# Patient Record
Sex: Female | Born: 1970
Health system: Southern US, Community
[De-identification: ages and names within clinical notes are randomized; demographics above are authoritative.]

## PROBLEM LIST (undated history)

## (undated) DIAGNOSIS — K449 Diaphragmatic hernia without obstruction or gangrene: Secondary | ICD-10-CM

## (undated) DIAGNOSIS — I639 Cerebral infarction, unspecified: Secondary | ICD-10-CM

## (undated) DIAGNOSIS — G459 Transient cerebral ischemic attack, unspecified: Secondary | ICD-10-CM

## (undated) HISTORY — PX: INNER EAR SURGERY: SHX679

---
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
## 2004-07-22 ENCOUNTER — Emergency Department (HOSPITAL_COMMUNITY): Admission: EM | Admit: 2004-07-22 | Discharge: 2004-07-22 | Payer: Self-pay | Admitting: Emergency Medicine

## 2005-12-03 ENCOUNTER — Emergency Department (HOSPITAL_COMMUNITY): Admission: EM | Admit: 2005-12-03 | Discharge: 2005-12-03 | Payer: Self-pay | Admitting: Family Medicine

## 2006-10-12 ENCOUNTER — Emergency Department (HOSPITAL_COMMUNITY): Admission: EM | Admit: 2006-10-12 | Discharge: 2006-10-12 | Payer: Self-pay | Admitting: Emergency Medicine

## 2006-12-26 ENCOUNTER — Emergency Department (HOSPITAL_COMMUNITY): Admission: EM | Admit: 2006-12-26 | Discharge: 2006-12-26 | Payer: Self-pay | Admitting: Emergency Medicine

## 2008-02-11 ENCOUNTER — Emergency Department (HOSPITAL_COMMUNITY): Admission: EM | Admit: 2008-02-11 | Discharge: 2008-02-11 | Payer: Self-pay | Admitting: Emergency Medicine

## 2008-02-15 ENCOUNTER — Ambulatory Visit (HOSPITAL_COMMUNITY): Admission: RE | Admit: 2008-02-15 | Discharge: 2008-02-15 | Payer: Self-pay | Admitting: Emergency Medicine

## 2008-03-03 ENCOUNTER — Emergency Department (HOSPITAL_COMMUNITY): Admission: EM | Admit: 2008-03-03 | Discharge: 2008-03-03 | Payer: Self-pay | Admitting: Emergency Medicine

## 2010-05-23 ENCOUNTER — Emergency Department (HOSPITAL_COMMUNITY): Admission: EM | Admit: 2010-05-23 | Discharge: 2010-05-23 | Payer: Self-pay | Admitting: Family Medicine

## 2011-04-05 LAB — POCT URINALYSIS DIP (DEVICE)
Operator id: 239701
Protein, ur: NEGATIVE
Specific Gravity, Urine: <=1.005
Urobilinogen, UA: 0.2
pH: 5.5

## 2011-04-05 LAB — WET PREP, GENITAL: WBC, Wet Prep HPF POC: NONE SEEN

## 2011-06-23 ENCOUNTER — Emergency Department (HOSPITAL_COMMUNITY): Payer: Self-pay

## 2011-06-23 ENCOUNTER — Encounter: Payer: Self-pay | Admitting: Emergency Medicine

## 2011-06-23 ENCOUNTER — Emergency Department (HOSPITAL_COMMUNITY)
Admission: EM | Admit: 2011-06-23 | Discharge: 2011-06-23 | Disposition: A | Discharge status: Home / Self Care | Payer: Self-pay | Attending: Emergency Medicine | Admitting: Emergency Medicine

## 2011-06-23 DIAGNOSIS — S86819A Strain of other muscle(s) and tendon(s) at lower leg level, unspecified leg, initial encounter: Secondary | ICD-10-CM | POA: Insufficient documentation

## 2011-06-23 DIAGNOSIS — S76119A Strain of unspecified quadriceps muscle, fascia and tendon, initial encounter: Secondary | ICD-10-CM

## 2011-06-23 DIAGNOSIS — K089 Disorder of teeth and supporting structures, unspecified: Secondary | ICD-10-CM | POA: Insufficient documentation

## 2011-06-23 DIAGNOSIS — S838X9A Sprain of other specified parts of unspecified knee, initial encounter: Secondary | ICD-10-CM | POA: Insufficient documentation

## 2011-06-23 DIAGNOSIS — K029 Dental caries, unspecified: Secondary | ICD-10-CM | POA: Insufficient documentation

## 2011-06-23 DIAGNOSIS — K0889 Other specified disorders of teeth and supporting structures: Secondary | ICD-10-CM

## 2011-06-23 DIAGNOSIS — X58XXXA Exposure to other specified factors, initial encounter: Secondary | ICD-10-CM | POA: Insufficient documentation

## 2011-06-23 MED ORDER — OXYCODONE-ACETAMINOPHEN 5-325 MG PO TABS
2.0000 | ORAL_TABLET | Freq: Once | ORAL | Status: DC
  Filled 2011-06-23: qty 2

## 2011-06-23 MED ORDER — PENICILLIN V POTASSIUM 500 MG PO TABS: 500.0000 mg | ORAL_TABLET | Freq: Four times a day (QID) | ORAL | Status: AC

## 2011-06-23 MED ORDER — KETOROLAC TROMETHAMINE 60 MG/2ML IM SOLN
60.0000 mg | Freq: Once | INTRAMUSCULAR | Status: AC
  Administered 2011-06-23: 60 mg via INTRAMUSCULAR
  Filled 2011-06-23: qty 2

## 2011-06-23 MED ORDER — TRAMADOL HCL 50 MG PO TABS: 50.0000 mg | ORAL_TABLET | Freq: Four times a day (QID) | ORAL | Status: AC | PRN

## 2011-06-23 NOTE — ED Provider Notes (Signed)
History     CSN: [id number] Arrival date & time: 06/23/2011 12:07 PM   First MD Initiated Contact with Patient 06/23/11 1211      Chief Complaint  Patient presents with  . Knee Pain    r/thigh pain, intermittent x 3 months  . Dental Pain    2 day hx of broken tooth on r/side of mouth   Patient is a 40 y.o. female presenting with tooth pain.  Dental PainThe primary symptoms include dental injury. Primary symptoms do not include mouth pain, oral bleeding, oral lesions, headaches, fever, shortness of breath, sore throat, angioedema or cough.  Additional symptoms do not include: gum swelling, trismus, jaw pain, facial swelling, trouble swallowing, pain with swallowing, excessive salivation, taste disturbance, drooling, ear pain, hearing loss and fatigue.   Patient presents to the ED with complaint of three months of right thigh pain, after pulling her muscle per patient. Denies any falls or trauma. Patient reports that she also has increased pain to her right upper tooth after it broke two days ago. States that she has seen Dr. Elesa Hacker as her dentist. Patient denies any other problems.  History reviewed. No pertinent past medical history.  Past Surgical History  Procedure Date  . Tonsillectomy   . Appendectomy   . Kidney surgery     Family History  Problem Relation Age of Onset  . Hypertension Mother   . Diabetes Mother   . Hypertension Father   . Diabetes Father     History  Substance Use Topics  . Smoking status: Current Everyday Smoker  . Smokeless tobacco: Not on file  . Alcohol Use: No    OB History    Grav Para Term Preterm Abortions TAB SAB Ect Mult Living                  Review of Systems  Constitutional: Negative for fever and fatigue.  HENT: Negative for hearing loss, ear pain, sore throat, facial swelling, drooling and trouble swallowing.   Respiratory: Negative for cough and shortness of breath.   Neurological: Negative for headaches.    Allergies    Codeine; Hydrocodone; and Percocet  Home Medications   Current Outpatient Rx  Name Route Sig Dispense Refill  . ACETAMINOPHEN 500 MG PO TABS Oral Take 1,000 mg by mouth every 6 (six) hours as needed.      . IBUPROFEN 200 MG PO TABS Oral Take 400 mg by mouth every 6 (six) hours as needed.        BP 111/77  Pulse 75  Temp(Src) 98.6 F (37 C) (Oral)  Resp 18  SpO2 99%  LMP 06/23/2011  Physical Exam  Nursing note and vitals reviewed. Constitutional: She is oriented to person, place, and time. She appears well-developed and well-nourished.       Nontoxic. Afebrile.  HENT:  Head: Normocephalic and atraumatic. No trismus in the jaw.  Right Ear: Tympanic membrane and external ear normal.  Left Ear: Tympanic membrane and external ear normal.  Nose: Nose normal.  Mouth/Throat: Uvula is midline, oropharynx is clear and moist and mucous membranes are normal. She does not have dentures. No oral lesions. Dental caries present. No dental abscesses, uvula swelling or lacerations. No oropharyngeal exudate, posterior oropharyngeal edema, posterior oropharyngeal erythema or tonsillar abscesses.       poor dentition, widespread dental decay, TM L/R normal, mucous membranes moist, no soft tissue swelling under the tongue. no trismus. no facial swelling. no tongue swelling or elevation. Dental caries  noted to the right canine upper tooth.  Eyes: EOM are normal. Pupils are equal, round, and reactive to light. Right eye exhibits no discharge. Left eye exhibits no discharge.  Neck: Normal range of motion. Neck supple. No JVD present. No tracheal tenderness present. No rigidity. No tracheal deviation, no edema and normal range of motion present. No thyromegaly present.       No neck tenderness to palpation. no meningeal signs, no lympahdenoapthy      Cardiovascular: Normal rate, regular rhythm and normal heart sounds.  Exam reveals no gallop and no friction rub.   No murmur heard. Pulmonary/Chest:  Effort normal and breath sounds normal. No stridor. No respiratory distress. She has no wheezes. She has no rales. She exhibits no tenderness.  Abdominal: She exhibits no distension. There is no tenderness.  Musculoskeletal: Normal range of motion. She exhibits tenderness.       Right quadriceps pain at the vastus medialis  Lymphadenopathy:    She has no cervical adenopathy.  Neurological: She is alert and oriented to person, place, and time.  Skin: Skin is warm and dry.  Psychiatric: She has a normal mood and affect. Her behavior is normal. Judgment and thought content normal.    ED Course  Procedures (including critical care time)  Labs Reviewed - No data to display Dg Knee Complete 4 Views Right  06/23/2011  *RADIOLOGY REPORT*  Clinical Data:   pain.  Dislocated patella.  RIGHT KNEE - COMPLETE 4+ VIEW  Comparison: 10/12/2006  Findings: There is no evidence of fracture or dislocation.  There is no evidence of arthropathy or other focal bone abnormality. Soft tissues are unremarkable.  IMPRESSION: Negative exam  Original Report Authenticated By: Rosealee Albee, M.D.   Patient seen and evaluated.  VSS reviewed. . Nursing notes reviewed.  Initial testing ordered. Will monitor the patient closely. They agree with the treatment plan and diagnosis.   Patient seen and re-evaluated. Resting comfortably. VSS stable. NAD. Patient notified of testing results. Stated agreement and understanding. Patient stated understanding to treatment plan and diagnosis. Patient advised of warning signs to return. Stated agreement and understanding.     MDM  Toothache Right thigh pain.       Demetrius Charity, PA 06/23/11 1350  Demetrius Charity, [place] 06/23/11 1352

## 2011-06-23 NOTE — ED Provider Notes (Signed)
Evaluation and management procedures were performed by the PA/NP under my supervision/collaboration.   Dione Booze, MD 06/23/11 [phone]

## 2014-08-24 ENCOUNTER — Emergency Department (HOSPITAL_BASED_OUTPATIENT_CLINIC_OR_DEPARTMENT_OTHER)
Admission: EM | Admit: 2014-08-24 | Discharge: 2014-08-24 | Disposition: A | Discharge status: Home / Self Care | Payer: Self-pay | Attending: Emergency Medicine | Admitting: Emergency Medicine

## 2014-08-24 ENCOUNTER — Encounter (HOSPITAL_BASED_OUTPATIENT_CLINIC_OR_DEPARTMENT_OTHER): Payer: Self-pay | Admitting: Emergency Medicine

## 2014-08-24 DIAGNOSIS — Z720 Tobacco use: Secondary | ICD-10-CM | POA: Insufficient documentation

## 2014-08-24 DIAGNOSIS — R197 Diarrhea, unspecified: Secondary | ICD-10-CM | POA: Insufficient documentation

## 2014-08-24 DIAGNOSIS — Z3202 Encounter for pregnancy test, result negative: Secondary | ICD-10-CM | POA: Insufficient documentation

## 2014-08-24 DIAGNOSIS — R1110 Vomiting, unspecified: Secondary | ICD-10-CM | POA: Insufficient documentation

## 2014-08-24 LAB — COMPREHENSIVE METABOLIC PANEL
ALBUMIN: 4.2 g/dL (ref 3.5–5.2)
ALT: 15 U/L (ref 0–35)
ANION GAP: 8 (ref 5–15)
AST: 23 U/L (ref 0–37)
Alkaline Phosphatase: 81 U/L (ref 39–117)
BILIRUBIN TOTAL: 0.5 mg/dL (ref 0.3–1.2)
BUN: 10 mg/dL (ref 6–23)
CALCIUM: 8.6 mg/dL (ref 8.4–10.5)
CHLORIDE: 106 mmol/L (ref 96–112)
CO2: 23 mmol/L (ref 19–32)
CREATININE: 0.43 mg/dL — AB (ref 0.50–1.10)
GFR calc Af Amer: >90 mL/min (ref >90)
GFR calc non Af Amer: >90 mL/min (ref >90)
Glucose, Bld: 135 mg/dL — ABNORMAL HIGH (ref 70–99)
Potassium: 3.6 mmol/L (ref 3.5–5.1)
Sodium: 137 mmol/L (ref 135–145)
TOTAL PROTEIN: 7.4 g/dL (ref 6.0–8.3)

## 2014-08-24 LAB — URINALYSIS, ROUTINE W REFLEX MICROSCOPIC
BILIRUBIN URINE: NEGATIVE
GLUCOSE, UA: NEGATIVE mg/dL
Ketones, ur: NEGATIVE mg/dL
Nitrite: NEGATIVE
PROTEIN: NEGATIVE mg/dL
SPECIFIC GRAVITY, URINE: 1.019 (ref 1.005–1.030)
Urobilinogen, UA: 0.2 mg/dL (ref 0.0–1.0)
pH: 5 (ref 5.0–8.0)

## 2014-08-24 LAB — URINE MICROSCOPIC-ADD ON

## 2014-08-24 LAB — PREGNANCY, URINE: Preg Test, Ur: NEGATIVE

## 2014-08-24 MED ORDER — SODIUM CHLORIDE 0.9 % IV BOLUS (SEPSIS)
1000.0000 mL | Freq: Once | INTRAVENOUS | Status: AC
  Administered 2014-08-24: 1000 mL via INTRAVENOUS

## 2014-08-24 MED ORDER — DICYCLOMINE HCL 10 MG/ML IM SOLN
20.0000 mg | Freq: Once | INTRAMUSCULAR | Status: AC
  Administered 2014-08-24: 20 mg via INTRAMUSCULAR
  Filled 2014-08-24: qty 2

## 2014-08-24 MED ORDER — ONDANSETRON HCL 4 MG/2ML IJ SOLN
4.0000 mg | Freq: Once | INTRAMUSCULAR | Status: AC
  Administered 2014-08-24: 4 mg via INTRAVENOUS
  Filled 2014-08-24: qty 2

## 2014-08-24 MED ORDER — ONDANSETRON 8 MG PO TBDP: ORAL_TABLET | ORAL | Status: DC

## 2014-08-24 MED ORDER — SODIUM CHLORIDE 0.9 % IV SOLN
Freq: Once | INTRAVENOUS | Status: AC
  Administered 2014-08-24: 03:00:00 via INTRAVENOUS

## 2014-08-24 NOTE — ED Provider Notes (Signed)
CSN: [id number]     Arrival date & time 08/24/14  0113 History   First MD Initiated Contact with Patient 08/24/14 0236     Chief Complaint  Patient presents with  . Emesis     (Consider location/radiation/quality/duration/timing/severity/associated sxs/prior Treatment) Patient is a 44 y.o. female presenting with vomiting. The history is provided by the patient.  Emesis Severity:  Moderate Timing:  Intermittent Quality:  Stomach contents Progression:  Unchanged Chronicity:  New Recent urination:  Normal Context: not post-tussive   Relieved by:  Nothing Worsened by:  Nothing tried Ineffective treatments:  None tried Associated symptoms: diarrhea   Risk factors: sick contacts   Risk factors comment:  2 family mebers with the same   History reviewed. No pertinent past medical history. Past Surgical History  Procedure Laterality Date  . Tonsillectomy    . Appendectomy    . Kidney surgery     Family History  Problem Relation Age of Onset  . Hypertension Mother   . Diabetes Mother   . Hypertension Father   . Diabetes Father    History  Substance Use Topics  . Smoking status: Current Every Day Smoker  . Smokeless tobacco: Not on file  . Alcohol Use: No   OB History    No data available     Review of Systems  Gastrointestinal: Positive for vomiting and diarrhea.  All other systems reviewed and are negative.     Allergies  Codeine; Hydrocodone; and Percocet  Home Medications   Prior to Admission medications   Medication Sig Start Date End Date Taking? Authorizing Provider  acetaminophen (TYLENOL) 500 MG tablet Take 1,000 mg by mouth every 6 (six) hours as needed.      Historical Provider, MD  ibuprofen (ADVIL,MOTRIN) 200 MG tablet Take 400 mg by mouth every 6 (six) hours as needed.      Historical Provider, MD   BP 122/70 mmHg  Pulse 104  Temp(Src) 99.4 F (37.4 C) (Oral)  Resp 18  Ht  (1.702 m)  Wt 150 lb (68.04 kg)  BMI 23.49 kg/m2  SpO2 100%   LMP 07/24/2014 Physical Exam  Constitutional: She is oriented to person, place, and time. She appears well-developed and well-nourished. No distress.  HENT:  Head: Normocephalic and atraumatic.  Mouth/Throat: Oropharynx is clear and moist.  Eyes: Conjunctivae are normal. Pupils are equal, round, and reactive to light.  Neck: Normal range of motion. Neck supple.  Cardiovascular: Normal rate, regular rhythm and intact distal pulses.   Pulmonary/Chest: Effort normal and breath sounds normal. No respiratory distress. She has no wheezes. She has no rales.  Abdominal: Soft. Bowel sounds are normal. There is no tenderness. There is no rebound and no guarding.  Musculoskeletal: Normal range of motion.  Neurological: She is alert and oriented to person, place, and time.  Skin: Skin is warm and dry.  Psychiatric: She has a normal mood and affect.    ED Course  Procedures (including critical care time) Labs Review Labs Reviewed  URINALYSIS, ROUTINE W REFLEX MICROSCOPIC - Abnormal; Notable for the following:    APPearance CLOUDY (*)    Hgb urine dipstick SMALL (*)    Leukocytes, UA MODERATE (*)    All other components within normal limits  URINE MICROSCOPIC-ADD ON - Abnormal; Notable for the following:    Squamous Epithelial / LPF MANY (*)    Bacteria, UA MANY (*)    All other components within normal limits  PREGNANCY, URINE  COMPREHENSIVE METABOLIC PANEL  Imaging Review No results found.   EKG Interpretation None      MDM   Final diagnoses:  None    Viral enteritis, multiple family mebers with same.  Feels better post meds strict return precautions given    Lanore Renderos Smitty CordsK Shey Bartmess-Rasch, MD 08/24/14 [phone]

## 2014-08-24 NOTE — ED Notes (Signed)
Pt states that since 7pm she has been having V/D

## 2015-04-24 ENCOUNTER — Emergency Department (HOSPITAL_BASED_OUTPATIENT_CLINIC_OR_DEPARTMENT_OTHER)
Admission: EM | Admit: 2015-04-24 | Discharge: 2015-04-24 | Disposition: A | Discharge status: Home / Self Care | Payer: Self-pay | Attending: Emergency Medicine | Admitting: Emergency Medicine

## 2015-04-24 ENCOUNTER — Encounter (HOSPITAL_BASED_OUTPATIENT_CLINIC_OR_DEPARTMENT_OTHER): Payer: Self-pay | Admitting: Emergency Medicine

## 2015-04-24 DIAGNOSIS — R109 Unspecified abdominal pain: Secondary | ICD-10-CM

## 2015-04-24 DIAGNOSIS — Z3202 Encounter for pregnancy test, result negative: Secondary | ICD-10-CM | POA: Insufficient documentation

## 2015-04-24 DIAGNOSIS — R197 Diarrhea, unspecified: Secondary | ICD-10-CM | POA: Insufficient documentation

## 2015-04-24 DIAGNOSIS — R0981 Nasal congestion: Secondary | ICD-10-CM | POA: Insufficient documentation

## 2015-04-24 DIAGNOSIS — Z720 Tobacco use: Secondary | ICD-10-CM | POA: Insufficient documentation

## 2015-04-24 DIAGNOSIS — R002 Palpitations: Secondary | ICD-10-CM | POA: Insufficient documentation

## 2015-04-24 DIAGNOSIS — N390 Urinary tract infection, site not specified: Secondary | ICD-10-CM | POA: Insufficient documentation

## 2015-04-24 LAB — LIPASE, BLOOD: Lipase: 46 U/L (ref 22–51)

## 2015-04-24 LAB — COMPREHENSIVE METABOLIC PANEL
ALBUMIN: 4.1 g/dL (ref 3.5–5.0)
ALK PHOS: 76 U/L (ref 38–126)
ALT: 12 U/L — ABNORMAL LOW (ref 14–54)
ANION GAP: 8 (ref 5–15)
AST: 21 U/L (ref 15–41)
BILIRUBIN TOTAL: 0.3 mg/dL (ref 0.3–1.2)
BUN: 6 mg/dL (ref 6–20)
CALCIUM: 8.9 mg/dL (ref 8.9–10.3)
CO2: 24 mmol/L (ref 22–32)
Chloride: 109 mmol/L (ref 101–111)
Creatinine, Ser: 0.65 mg/dL (ref 0.44–1.00)
GFR calc Af Amer: >60 mL/min (ref >60)
GFR calc non Af Amer: >60 mL/min (ref >60)
GLUCOSE: 105 mg/dL — AB (ref 65–99)
Potassium: 4.1 mmol/L (ref 3.5–5.1)
SODIUM: 141 mmol/L (ref 135–145)
TOTAL PROTEIN: 7.3 g/dL (ref 6.5–8.1)

## 2015-04-24 LAB — URINE MICROSCOPIC-ADD ON

## 2015-04-24 LAB — DIFFERENTIAL
Basophils Absolute: 0 10*3/uL (ref 0.0–0.1)
Basophils Relative: 0 %
EOS ABS: 0 10*3/uL (ref 0.0–0.7)
EOS PCT: 0 %
LYMPHS ABS: 0.9 10*3/uL (ref 0.7–4.0)
Lymphocytes Relative: 7 %
MONO ABS: 0.7 10*3/uL (ref 0.1–1.0)
MONOS PCT: 5 %
NEUTROS PCT: 88 %
Neutro Abs: 11.2 10*3/uL — ABNORMAL HIGH (ref 1.7–7.7)

## 2015-04-24 LAB — URINALYSIS, ROUTINE W REFLEX MICROSCOPIC
BILIRUBIN URINE: NEGATIVE
GLUCOSE, UA: NEGATIVE mg/dL
Ketones, ur: NEGATIVE mg/dL
Nitrite: NEGATIVE
Protein, ur: NEGATIVE mg/dL
SPECIFIC GRAVITY, URINE: 1.007 (ref 1.005–1.030)
Urobilinogen, UA: 0.2 mg/dL (ref 0.0–1.0)
pH: 7.5 (ref 5.0–8.0)

## 2015-04-24 LAB — CBC
HEMATOCRIT: 36.2 % (ref 36.0–46.0)
HEMOGLOBIN: 11.7 g/dL — AB (ref 12.0–15.0)
MCH: 29.9 pg (ref 26.0–34.0)
MCHC: 32.3 g/dL (ref 30.0–36.0)
MCV: 92.6 fL (ref 78.0–100.0)
Platelets: 246 10*3/uL (ref 150–400)
RBC: 3.91 MIL/uL (ref 3.87–5.11)
RDW: 13.3 % (ref 11.5–15.5)
WBC: 12.9 10*3/uL — ABNORMAL HIGH (ref 4.0–10.5)

## 2015-04-24 LAB — PREGNANCY, URINE: Preg Test, Ur: NEGATIVE

## 2015-04-24 MED ORDER — MORPHINE SULFATE (PF) 4 MG/ML IV SOLN
4.0000 mg | Freq: Once | INTRAVENOUS | Status: DC
  Filled 2015-04-24: qty 1

## 2015-04-24 MED ORDER — CEPHALEXIN 500 MG PO CAPS: 500.0000 mg | ORAL_CAPSULE | Freq: Four times a day (QID) | ORAL | Status: DC

## 2015-04-24 MED ORDER — SODIUM CHLORIDE 0.9 % IV SOLN
1000.0000 mL | Freq: Once | INTRAVENOUS | Status: AC
  Administered 2015-04-24: 1000 mL via INTRAVENOUS

## 2015-04-24 MED ORDER — ONDANSETRON HCL 4 MG/2ML IJ SOLN
4.0000 mg | Freq: Once | INTRAMUSCULAR | Status: AC
  Administered 2015-04-24: 4 mg via INTRAVENOUS
  Filled 2015-04-24: qty 2

## 2015-04-24 MED ORDER — DEXTROSE 5 % IV SOLN
1.0000 g | Freq: Once | INTRAVENOUS | Status: AC
  Administered 2015-04-24: 1 g via INTRAVENOUS

## 2015-04-24 MED ORDER — METOCLOPRAMIDE HCL 10 MG PO TABS: 10.0000 mg | ORAL_TABLET | Freq: Four times a day (QID) | ORAL | Status: DC | PRN

## 2015-04-24 MED ORDER — PANTOPRAZOLE SODIUM 40 MG IV SOLR
40.0000 mg | Freq: Once | INTRAVENOUS | Status: AC
  Administered 2015-04-24: 40 mg via INTRAVENOUS
  Filled 2015-04-24: qty 40

## 2015-04-24 MED ORDER — TRAMADOL HCL 50 MG PO TABS: 50.0000 mg | ORAL_TABLET | Freq: Four times a day (QID) | ORAL | Status: DC | PRN

## 2015-04-24 MED ORDER — KETOROLAC TROMETHAMINE 30 MG/ML IJ SOLN
30.0000 mg | INTRAMUSCULAR | Status: AC
  Administered 2015-04-24: 30 mg via INTRAVENOUS
  Filled 2015-04-24: qty 1

## 2015-04-24 MED ORDER — CEFTRIAXONE SODIUM 1 G IJ SOLR
INTRAMUSCULAR | Status: AC
  Filled 2015-04-24: qty 10

## 2015-04-24 MED ORDER — SODIUM CHLORIDE 0.9 % IV SOLN: 1000.0000 mL | INTRAVENOUS | Status: DC

## 2015-04-24 NOTE — ED Provider Notes (Signed)
CSN: [id number]     Arrival date & time 04/24/15  1717 History  By signing my name below, I, Lyndel Safe, attest that this documentation has been prepared under the direction and in the presence of Dione Booze, MD. Electronically Signed: Lyndel Safe, ED Scribe. 04/24/2015. 6:54 PM.   Chief Complaint  Patient presents with  . Abdominal Pain   The history is provided by the patient. No language interpreter was used.   HPI Comments: Regina Thomas is a 44 y.o. female, with a PMhx of hiatal hernia and GERD, who presents to the Emergency Department complaining of sudden onset, 7/10, burning epigastric abdominal pain after eating [place] food for lunch today with associated palpitations, nausea with retching, and minimal diarrhea immediately following food intake. She endorses taking Nexium for GERD and states she took this medication today before she ate lunch. She additionally notes URI symptoms significant for nasal congestion, fatigue, chills, and diaphoresis onset 2 days ago. Pt is afebrile on triage vitals.  Denies radiation of pain to her back or vomiting.   History reviewed. No pertinent past medical history. Past Surgical History  Procedure Laterality Date  . Tonsillectomy    . Appendectomy    . Kidney surgery     Family History  Problem Relation Age of Onset  . Hypertension Mother   . Diabetes Mother   . Hypertension Father   . Diabetes Father    Social History  Substance Use Topics  . Smoking status: Current Every Day Smoker  . Smokeless tobacco: None  . Alcohol Use: No   OB History    No data available     Review of Systems  Constitutional: Positive for chills, diaphoresis and fatigue. Negative for fever.  HENT: Positive for congestion.   Cardiovascular: Positive for palpitations.  Gastrointestinal: Positive for nausea, abdominal pain and diarrhea. Negative for vomiting.  Musculoskeletal: Negative for back pain.  All other systems reviewed and are  negative.  Allergies  Codeine; Hydrocodone; Percocet; and Zithromax  Home Medications   Prior to Admission medications   Medication Sig Start Date End Date Taking? Authorizing Provider  acetaminophen (TYLENOL) 500 MG tablet Take 1,000 mg by mouth every 6 (six) hours as needed.      Historical Provider, MD  ibuprofen (ADVIL,MOTRIN) 200 MG tablet Take 400 mg by mouth every 6 (six) hours as needed.      Historical Provider, MD  ondansetron (ZOFRAN ODT) 8 MG disintegrating tablet  ODT q8 hours prn nausea 08/24/14   April Palumbo, MD   BP 140/86 mmHg  Pulse 90  Temp(Src) 98.8 F (37.1 C) (Oral)  Resp 18  Ht  (1.702 m)  Wt 152 lb (68.947 kg)  BMI 23.80 kg/m2  SpO2 100% Physical Exam  Constitutional: She is oriented to person, place, and time. She appears well-developed and well-nourished.  HENT:  Head: Normocephalic and atraumatic.  Eyes: EOM are normal. Pupils are equal, round, and reactive to light. No scleral icterus.  Neck: Normal range of motion. Neck supple.  Cardiovascular: Normal rate, regular rhythm and normal heart sounds.   No murmur heard. Pulmonary/Chest: Effort normal and breath sounds normal. She has no wheezes. She has no rales. She exhibits no tenderness.  Abdominal: Soft. She exhibits no distension and no mass. There is tenderness.  Mild to moderate epigastric and LUQ tenderness, no rebound or guarding, bowel sounds decreased.   Musculoskeletal: Normal range of motion. She exhibits no edema.  Neurological: She is alert and oriented to person, place,  and time. No cranial nerve deficit. She exhibits normal muscle tone. Coordination normal.  Skin: Skin is warm and dry. No rash noted.  Psychiatric: She has a normal mood and affect. Her behavior is normal. Judgment and thought content normal.  Nursing note and vitals reviewed.   ED Course  Procedures  DIAGNOSTIC STUDIES: Oxygen Saturation is 100% on RA, normal by my interpretation.    COORDINATION OF  CARE: 6:53 PM Discussed treatment plan with pt at bedside and pt agreed to plan. Offered CT abdomen pelvis but pt refusing at this time. Will order IV fluids, pain medication, Protonix and Zofran.   Labs Review Results for orders placed or performed during the hospital encounter of 04/24/15  Lipase, blood  Result Value Ref Range   Lipase 46 22 - 51 U/L  Comprehensive metabolic panel  Result Value Ref Range   Sodium 141 135 - 145 mmol/L   Potassium 4.1 3.5 - 5.1 mmol/L   Chloride 109 101 - 111 mmol/L   CO2 24 22 - 32 mmol/L   Glucose, Bld 105 (H) 65 - 99 mg/dL   BUN 6 6 - 20 mg/dL   Creatinine, Ser 4.09 0.44 - 1.00 mg/dL   Calcium 8.9 8.9 - 81.1 mg/dL   Total Protein 7.3 6.5 - 8.1 g/dL   Albumin 4.1 3.5 - 5.0 g/dL   AST 21 15 - 41 U/L   ALT 12 (L) 14 - 54 U/L   Alkaline Phosphatase 76 38 - 126 U/L   Total Bilirubin 0.3 0.3 - 1.2 mg/dL   GFR calc non Af Amer >60 >60 mL/min   GFR calc Af Amer >60 >60 mL/min   Anion gap 8 5 - 15  CBC  Result Value Ref Range   WBC 12.9 (H) 4.0 - 10.5 K/uL   RBC 3.91 3.87 - 5.11 MIL/uL   Hemoglobin 11.7 (L) 12.0 - 15.0 g/dL   HCT 91.4 78.2 - 95.6 %   MCV 92.6 78.0 - 100.0 fL   MCH 29.9 26.0 - 34.0 pg   MCHC 32.3 30.0 - 36.0 g/dL   RDW 21.3 08.6 - 57.8 %   Platelets 246 150 - 400 K/uL  Urinalysis, Routine w reflex microscopic (not at [facility])  Result Value Ref Range   Color, Urine YELLOW YELLOW   APPearance CLOUDY (A) CLEAR   Specific Gravity, Urine 1.007 1.005 - 1.030   pH 7.5 5.0 - 8.0   Glucose, UA NEGATIVE NEGATIVE mg/dL   Hgb urine dipstick TRACE (A) NEGATIVE   Bilirubin Urine NEGATIVE NEGATIVE   Ketones, ur NEGATIVE NEGATIVE mg/dL   Protein, ur NEGATIVE NEGATIVE mg/dL   Urobilinogen, UA 0.2 0.0 - 1.0 mg/dL   Nitrite NEGATIVE NEGATIVE   Leukocytes, UA SMALL (A) NEGATIVE  Pregnancy, urine  Result Value Ref Range   Preg Test, Ur NEGATIVE NEGATIVE  Urine microscopic-add on  Result Value Ref Range   Squamous Epithelial / LPF FEW (A)  RARE   WBC, UA 3-6 <3 WBC/hpf   RBC / HPF 0-2 <3 RBC/hpf   Bacteria, UA MANY (A) RARE  Differential  Result Value Ref Range   Neutrophils Relative % 88 %   Neutro Abs 11.2 (H) 1.7 - 7.7 K/uL   Lymphocytes Relative 7 %   Lymphs Abs 0.9 0.7 - 4.0 K/uL   Monocytes Relative 5 %   Monocytes Absolute 0.7 0.1 - 1.0 K/uL   Eosinophils Relative 0 %   Eosinophils Absolute 0.0 0.0 - 0.7 K/uL   Basophils  Relative 0 %   Basophils Absolute 0.0 0.0 - 0.1 K/uL   I have personally reviewed and evaluated these lab results as part of my medical decision-making.   MDM   Final diagnoses:  Abdominal pain, unspecified abdominal location  Urinary tract infection without hematuria, site unspecified    Upper abdominal pain of uncertain cause. She does take Nexium on a regular basis and has been vomiting and pain may be related to not have to Nexium in her system. Exam is otherwise benign. CT was recommended, but the patient has refused. She was given IV fluids, IV ondansetron, and IV pantoprazole as well as IV ketorolac. She feels considerably better following this. Laboratory workup was significant only for mild leukocytosis although with a left shift. Incidental finding on urinalysis of urinary tract infection and urine has been sent for culture. Patient is discharged with prescriptions for Celexa and metoclopramide. She is given a small amount of common TAKE for pain. She is advised to return should symptoms worsen, or if they're not improved after 2 days. She is to resume taking her Nexium at home.  I, Tymon Nemetz, personally performed the services described in this documentation. All medical record entries made by the scribe were at my direction and in my presence.  I have reviewed the chart and discharge instructions and agree that the record reflects my personal performance and is accurate and complete. Jamera Vanloan.  04/24/2015. 8:01 PM.      Dione Boozeavid Clement Deneault, MD 04/24/15 2001

## 2015-04-24 NOTE — ED Notes (Signed)
Patient states that she is having a lot of facial congestion. Then today had [place] and started to have abdominal pain and dry heaves

## 2015-04-24 NOTE — Discharge Instructions (Signed)
Return at any point if your symptoms are getting worse. Return in 2 days if you're not showing any improvement. Make sure to resume taking your Nexium.   Abdominal Pain, Adult Many things can cause abdominal pain. Usually, abdominal pain is not caused by a disease and will improve without treatment. It can often be observed and treated at home. Your health care provider will do a physical exam and possibly order blood tests and X-rays to help determine the seriousness of your pain. However, in many cases, more time must pass before a clear cause of the pain can be found. Before that point, your health care provider may not know if you need more testing or further treatment. HOME CARE INSTRUCTIONS Monitor your abdominal pain for any changes. The following actions may help to alleviate any discomfort you are experiencing:  Only take over-the-counter or prescription medicines as directed by your health care provider.  Do not take laxatives unless directed to do so by your health care provider.  Try a clear liquid diet (broth, tea, or water) as directed by your health care provider. Slowly move to a bland diet as tolerated. SEEK MEDICAL CARE IF:  You have unexplained abdominal pain.  You have abdominal pain associated with nausea or diarrhea.  You have pain when you urinate or have a bowel movement.  You experience abdominal pain that wakes you in the night.  You have abdominal pain that is worsened or improved by eating food.  You have abdominal pain that is worsened with eating fatty foods.  You have a fever. SEEK IMMEDIATE MEDICAL CARE IF:  Your pain does not go away within 2 hours.  You keep throwing up (vomiting).  Your pain is felt only in portions of the abdomen, such as the right side or the left lower portion of the abdomen.  You pass bloody or black tarry stools. MAKE SURE YOU:  Understand these instructions.  Will watch your condition.  Will get help right away if  you are not doing well or get worse.   This information is not intended to replace advice given to you by your health care provider. Make sure you discuss any questions you have with your health care provider.   Document Released: 04/03/2005 Document Revised: 03/15/2015 Document Reviewed: 03/03/2013 Elsevier Interactive Patient Education 2016 Else[employer].  Urinary Tract Infection Urinary tract infections (UTIs) can develop anywhere along your urinary tract. Your urinary tract is your body's drainage system for removing wastes and extra water. Your urinary tract includes two kidneys, two ureters, a bladder, and a urethra. Your kidneys are a pair of bean-shaped organs. Each kidney is about the size of your fist. They are located below your ribs, one on each side of your spine. CAUSES Infections are caused by microbes, which are microscopic organisms, including fungi, viruses, and bacteria. These organisms are so small that they can only be seen through a microscope. Bacteria are the microbes that most commonly cause UTIs. SYMPTOMS  Symptoms of UTIs may vary by age and gender of the patient and by the location of the infection. Symptoms in young women typically include a frequent and intense urge to urinate and a painful, burning feeling in the bladder or urethra during urination. Older women and men are more likely to be tired, shaky, and weak and have muscle aches and abdominal pain. A fever may mean the infection is in your kidneys. Other symptoms of a kidney infection include pain in your back or sides below  the ribs, nausea, and vomiting. DIAGNOSIS To diagnose a UTI, your caregiver will ask you about your symptoms. Your caregiver will also ask you to provide a urine sample. The urine sample will be tested for bacteria and white blood cells. White blood cells are made by your body to help fight infection. TREATMENT  Typically, UTIs can be treated with medication. Because most UTIs are caused by a  bacterial infection, they usually can be treated with the use of antibiotics. The choice of antibiotic and length of treatment depend on your symptoms and the type of bacteria causing your infection. HOME CARE INSTRUCTIONS  If you were prescribed antibiotics, take them exactly as your caregiver instructs you. Finish the medication even if you feel better after you have only taken some of the medication.  Drink enough water and fluids to keep your urine clear or pale yellow.  Avoid caffeine, tea, and carbonated beverages. They tend to irritate your bladder.  Empty your bladder often. Avoid holding urine for long periods of time.  Empty your bladder before and after sexual intercourse.  After a bowel movement, women should cleanse from front to back. Use each tissue only once. SEEK MEDICAL CARE IF:   You have back pain.  You develop a fever.  Your symptoms do not begin to resolve within 3 days. SEEK IMMEDIATE MEDICAL CARE IF:   You have severe back pain or lower abdominal pain.  You develop chills.  You have nausea or vomiting.  You have continued burning or discomfort with urination. MAKE SURE YOU:   Understand these instructions.  Will watch your condition.  Will get help right away if you are not doing well or get worse.   This information is not intended to replace advice given to you by your health care provider. Make sure you discuss any questions you have with your health care provider.   Document Released: 04/03/2005 Document Revised: 03/15/2015 Document Reviewed: 08/02/2011 Elsevier Interactive Patient Education 2016 [place].  Cephalexin tablets or capsules What is this medicine? CEPHALEXIN (sef a LEX in) is a cephalosporin antibiotic. It is used to treat certain kinds of bacterial infections It will not work for colds, flu, or other viral infections. This medicine may be used for other purposes; ask your health care provider or pharmacist if you have  questions. What should I tell my health care provider before I take this medicine? They need to know if you have any of these conditions: -kidney disease -stomach or intestine problems, especially colitis -an unusual or allergic reaction to cephalexin, other cephalosporins, penicillins, other antibiotics, medicines, foods, dyes or preservatives -pregnant or trying to get pregnant -breast-feeding How should I use this medicine? Take this medicine by mouth with a full glass of water. Follow the directions on the prescription label. This medicine can be taken with or without food. Take your medicine at regular intervals. Do not take your medicine more often than directed. Take all of your medicine as directed even if you think you are better. Do not skip doses or stop your medicine early. Talk to your pediatrician regarding the use of this medicine in children. While this drug may be prescribed for selected conditions, precautions do apply. Overdosage: If you think you have taken too much of this medicine contact a poison control center or emergency room at once. NOTE: This medicine is only for you. Do not share this medicine with others. What if I miss a dose? If you miss a dose, take it as  soon as you can. If it is almost time for your next dose, take only that dose. Do not take double or extra doses. There should be at least 4 to 6 hours between doses. What may interact with this medicine? -probenecid -some other antibiotics This list may not describe all possible interactions. Give your health care provider a list of all the medicines, herbs, non-prescription drugs, or dietary supplements you use. Also tell them if you smoke, drink alcohol, or use illegal drugs. Some items may interact with your medicine. What should I watch for while using this medicine? Tell your doctor or health care professional if your symptoms do not begin to improve in a few days. Do not treat diarrhea with over the  counter products. Contact your doctor if you have diarrhea that lasts more than 2 days or if it is severe and watery. If you have diabetes, you may get a false-positive result for sugar in your urine. Check with your doctor or health care professional. What side effects may I notice from receiving this medicine? Side effects that you should report to your doctor or health care professional as soon as possible: -allergic reactions like skin rash, itching or hives, swelling of the face, lips, or tongue -breathing problems -pain or trouble passing urine -redness, blistering, peeling or loosening of the skin, including inside the mouth -severe or watery diarrhea -unusually weak or tired -yellowing of the eyes, skin Side effects that usually do not require medical attention (report to your doctor or health care professional if they continue or are bothersome): -gas or heartburn -genital or anal irritation -headache -joint or muscle pain -nausea, vomiting This list may not describe all possible side effects. Call your doctor for medical advice about side effects. You may report side effects to FDA at 1-800-FDA-1088. Where should I keep my medicine? Keep out of the reach of children. Store at room temperature between 59 and 86 degrees F (15 and 30 degrees C). Throw away any unused medicine after the expiration date. NOTE: This sheet is a summary. It may not cover all possible information. If you have questions about this medicine, talk to your doctor, pharmacist, or health care provider.    2016, Elsevier/Gold Standard. (2007-09-28 17:09:13)  Tramadol tablets What is this medicine? TRAMADOL (TRA ma dole) is a pain reliever. It is used to treat moderate to severe pain in adults. This medicine may be used for other purposes; ask your health care provider or pharmacist if you have questions. What should I tell my health care provider before I take this medicine? They need to know if you have any  of these conditions: -brain tumor -depression -drug abuse or addiction -head injury -if you frequently drink alcohol containing drinks -kidney disease or trouble passing urine -liver disease -lung disease, asthma, or breathing problems -seizures or epilepsy -suicidal thoughts, plans, or attempt; a previous suicide attempt by you or a family member -an unusual or allergic reaction to tramadol, codeine, other medicines, foods, dyes, or preservatives -pregnant or trying to get pregnant -breast-feeding How should I use this medicine? Take this medicine by mouth with a full glass of water. Follow the directions on the prescription label. If the medicine upsets your stomach, take it with food or milk. Do not take more medicine than you are told to take. Talk to your pediatrician regarding the use of this medicine in children. Special care may be needed. Overdosage: If you think you have taken too much of this medicine contact  a poison control center or emergency room at once. NOTE: This medicine is only for you. Do not share this medicine with others. What if I miss a dose? If you miss a dose, take it as soon as you can. If it is almost time for your next dose, take only that dose. Do not take double or extra doses. What may interact with this medicine? Do not take this medicine with any of the following medications: -MAOIs like Carbex, Eldepryl, Marplan, Nardil, and Parnate This medicine may also interact with the following medications: -alcohol or medicines that contain alcohol -antihistamines -benzodiazepines -bupropion -carbamazepine or oxcarbazepine -clozapine -cyclobenzaprine -digoxin -furazolidone -linezolid -medicines for depression, anxiety, or psychotic disturbances -medicines for migraine headache like almotriptan, eletriptan, frovatriptan, naratriptan, rizatriptan, sumatriptan, zolmitriptan -medicines for pain like pentazocine, buprenorphine, butorphanol, meperidine,  nalbuphine, and propoxyphene -medicines for sleep -muscle relaxants -naltrexone -phenobarbital -phenothiazines like perphenazine, thioridazine, chlorpromazine, mesoridazine, fluphenazine, prochlorperazine, promazine, and trifluoperazine -procarbazine -warfarin This list may not describe all possible interactions. Give your health care provider a list of all the medicines, herbs, non-prescription drugs, or dietary supplements you use. Also tell them if you smoke, drink alcohol, or use illegal drugs. Some items may interact with your medicine. What should I watch for while using this medicine? Tell your doctor or health care professional if your pain does not go away, if it gets worse, or if you have new or a different type of pain. You may develop tolerance to the medicine. Tolerance means that you will need a higher dose of the medicine for pain relief. Tolerance is normal and is expected if you take this medicine for a long time. Do not suddenly stop taking your medicine because you may develop a severe reaction. Your body becomes used to the medicine. This does NOT mean you are addicted. Addiction is a behavior related to getting and using a drug for a non-medical reason. If you have pain, you have a medical reason to take pain medicine. Your doctor will tell you how much medicine to take. If your doctor wants you to stop the medicine, the dose will be slowly lowered over time to avoid any side effects. You may get drowsy or dizzy. Do not drive, use machinery, or do anything that needs mental alertness until you know how this medicine affects you. Do not stand or sit up quickly, especially if you are an older patient. This reduces the risk of dizzy or fainting spells. Alcohol can increase or decrease the effects of this medicine. Avoid alcoholic drinks. You may have constipation. Try to have a bowel movement at least every 2 to 3 days. If you do not have a bowel movement for 3 days, call your doctor  or health care professional. Your mouth may get dry. Chewing sugarless gum or sucking hard candy, and drinking plenty of water may help. Contact your doctor if the problem does not go away or is severe. What side effects may I notice from receiving this medicine? Side effects that you should report to your doctor or health care professional as soon as possible: -allergic reactions like skin rash, itching or hives, swelling of the face, lips, or tongue -breathing difficulties, wheezing -confusion -itching -light headedness or fainting spells -redness, blistering, peeling or loosening of the skin, including inside the mouth -seizures Side effects that usually do not require medical attention (report to your doctor or health care professional if they continue or are bothersome): -constipation -dizziness -drowsiness -headache -nausea, vomiting This list may not  describe all possible side effects. Call your doctor for medical advice about side effects. You may report side effects to FDA at 1-800-FDA-1088. Where should I keep my medicine? Keep out of the reach of children. This medicine may cause accidental overdose and death if it taken by other adults, children, or pets. Mix any unused medicine with a substance like cat litter or coffee grounds. Then throw the medicine away in a sealed container like a sealed bag or a coffee can with a lid. Do not use the medicine after the expiration date. Store at room temperature between 15 and 30 degrees C (59 and 86 degrees F). NOTE: This sheet is a summary. It may not cover all possible information. If you have questions about this medicine, talk to your doctor, pharmacist, or health care provider.    2016, Elsevier/Gold Standard. (2013-08-20 15:42:09)  Metoclopramide tablets What is this medicine? METOCLOPRAMIDE (met oh kloe PRA mide) is used to treat the symptoms of gastroesophageal reflux disease (GERD) like heartburn. It is also used to treat  people with slow emptying of the stomach and intestinal tract. This medicine may be used for other purposes; ask your health care provider or pharmacist if you have questions. What should I tell my health care provider before I take this medicine? They need to know if you have any of these conditions: -breast cancer -depression -diabetes -heart failure -high blood pressure -kidney disease -liver disease -Parkinson's disease or a movement disorder -pheochromocytoma -seizures -stomach obstruction, bleeding, or perforation -an unusual or allergic reaction to metoclopramide, procainamide, sulfites, other medicines, foods, dyes, or preservatives -pregnant or trying to get pregnant -breast-feeding How should I use this medicine? Take this medicine by mouth with a glass of water. Follow the directions on the prescription label. Take this medicine on an empty stomach, about 30 minutes before eating. Take your doses at regular intervals. Do not take your medicine more often than directed. Do not stop taking except on the advice of your doctor or health care professional. A special MedGuide will be given to you by the pharmacist with each prescription and refill. Be sure to read this information carefully each time. Talk to your pediatrician regarding the use of this medicine in children. Special care may be needed. Overdosage: If you think you have taken too much of this medicine contact a poison control center or emergency room at once. NOTE: This medicine is only for you. Do not share this medicine with others. What if I miss a dose? If you miss a dose, take it as soon as you can. If it is almost time for your next dose, take only that dose. Do not take double or extra doses. What may interact with this medicine? -acetaminophen -cyclosporine -digoxin -medicines for blood pressure -medicines for diabetes, including insulin -medicines for hay fever and other allergies -medicines for  depression, especially an Monoamine Oxidase Inhibitor (MAOI) -medicines for Parkinson's disease, like levodopa -medicines for sleep or for pain -tetracycline This list may not describe all possible interactions. Give your health care provider a list of all the medicines, herbs, non-prescription drugs, or dietary supplements you use. Also tell them if you smoke, drink alcohol, or use illegal drugs. Some items may interact with your medicine. What should I watch for while using this medicine? It may take a few weeks for your stomach condition to start to get better. However, do not take this medicine for longer than 12 weeks. The longer you take this medicine, and the more  you take it, the greater your chances are of developing serious side effects. If you are an elderly patient, a female patient, or you have diabetes, you may be at an increased risk for side effects from this medicine. Contact your doctor immediately if you start having movements you cannot control such as lip smacking, rapid movements of the tongue, involuntary or uncontrollable movements of the eyes, head, arms and legs, or muscle twitches and spasms. Patients and their families should watch out for worsening depression or thoughts of suicide. Also watch out for any sudden or severe changes in feelings such as feeling anxious, agitated, panicky, irritable, hostile, aggressive, impulsive, severely restless, overly excited and hyperactive, or not being able to sleep. If this happens, especially at the beginning of treatment or after a change in dose, call your doctor. Do not treat yourself for high fever. Ask your doctor or health care professional for advice. You may get drowsy or dizzy. Do not drive, use machinery, or do anything that needs mental alertness until you know how this drug affects you. Do not stand or sit up quickly, especially if you are an older patient. This reduces the risk of dizzy or fainting spells. Alcohol can make you  more drowsy and dizzy. Avoid alcoholic drinks. What side effects may I notice from receiving this medicine? Side effects that you should report to your doctor or health care professional as soon as possible: -allergic reactions like skin rash, itching or hives, swelling of the face, lips, or tongue -abnormal production of milk in females -breast enlargement in both males and females -change in the way you walk -difficulty moving, speaking or swallowing -drooling, lip smacking, or rapid movements of the tongue -excessive sweating -fever -involuntary or uncontrollable movements of the eyes, head, arms and legs -irregular heartbeat or palpitations -muscle twitches and spasms -unusually weak or tired Side effects that usually do not require medical attention (report to your doctor or health care professional if they continue or are bothersome): -change in sex drive or performance -depressed mood -diarrhea -difficulty sleeping -headache -menstrual changes -restless or nervous This list may not describe all possible side effects. Call your doctor for medical advice about side effects. You may report side effects to FDA at 1-800-FDA-1088. Where should I keep my medicine? Keep out of the reach of children. Store at room temperature between 20 and 25 degrees C (68 and 77 degrees F). Protect from light. Keep container tightly closed. Throw away any unused medicine after the expiration date. NOTE: This sheet is a summary. It may not cover all possible information. If you have questions about this medicine, talk to your doctor, pharmacist, or health care provider.    2016, Elsevier/Gold Standard. (2011-10-22 13:04:38)

## 2015-04-24 NOTE — ED Notes (Signed)
Pt refused morphine and would like Tordal. EPD will be made aware

## 2015-04-24 NOTE — ED Notes (Signed)
MD at bedside. 

## 2015-04-27 LAB — URINE CULTURE

## 2015-04-28 ENCOUNTER — Telehealth (HOSPITAL_COMMUNITY): Payer: Self-pay

## 2015-04-28 NOTE — Telephone Encounter (Signed)
Post ED Visit - Positive Culture Follow-up  Culture report reviewed by antimicrobial stewardship pharmacist:  []  Celedonio MiyamotoJeremy Frens, Pharm.D., BCPS []  Georgina PillionElizabeth Martin, Pharm.D., BCPS []  LidgerwoodMinh Pham, [place]arm[place]D., BCPS, AAHIVP []  Estella HuskMichelle Turner, Pharm.D., BCPS, AAHIVP []  Colgate PalmoliveCristy Reyes, [address]Ph[address] []  Tennis Mustassie Stewart, [place]arm[place]D. Cena BentonX  Alyson Leonard, Pharm.D.  Positive urine culture, >/= 100,000 colonies -> E Coli Treated with Cephalexin, organism sensitive to the same and no further patient follow-up is required at this time   Arvid RightClark, Neo Yepiz Dorn 04/28/2015, 12:46 PM

## 2015-07-02 ENCOUNTER — Emergency Department (HOSPITAL_BASED_OUTPATIENT_CLINIC_OR_DEPARTMENT_OTHER)
Admission: EM | Admit: 2015-07-02 | Discharge: 2015-07-03 | Disposition: A | Discharge status: Home / Self Care | Payer: Self-pay | Attending: Emergency Medicine | Admitting: Emergency Medicine

## 2015-07-02 ENCOUNTER — Encounter (HOSPITAL_BASED_OUTPATIENT_CLINIC_OR_DEPARTMENT_OTHER): Payer: Self-pay | Admitting: Emergency Medicine

## 2015-07-02 DIAGNOSIS — N12 Tubulo-interstitial nephritis, not specified as acute or chronic: Secondary | ICD-10-CM | POA: Insufficient documentation

## 2015-07-02 DIAGNOSIS — Z3202 Encounter for pregnancy test, result negative: Secondary | ICD-10-CM | POA: Insufficient documentation

## 2015-07-02 DIAGNOSIS — R000 Tachycardia, unspecified: Secondary | ICD-10-CM | POA: Insufficient documentation

## 2015-07-02 DIAGNOSIS — F17200 Nicotine dependence, unspecified, uncomplicated: Secondary | ICD-10-CM | POA: Insufficient documentation

## 2015-07-02 DIAGNOSIS — Z79899 Other long term (current) drug therapy: Secondary | ICD-10-CM | POA: Insufficient documentation

## 2015-07-02 DIAGNOSIS — Z9889 Other specified postprocedural states: Secondary | ICD-10-CM | POA: Insufficient documentation

## 2015-07-02 DIAGNOSIS — R42 Dizziness and giddiness: Secondary | ICD-10-CM | POA: Insufficient documentation

## 2015-07-02 DIAGNOSIS — Z9049 Acquired absence of other specified parts of digestive tract: Secondary | ICD-10-CM | POA: Insufficient documentation

## 2015-07-02 LAB — URINALYSIS, ROUTINE W REFLEX MICROSCOPIC
Bilirubin Urine: NEGATIVE
Glucose, UA: NEGATIVE mg/dL
Ketones, ur: NEGATIVE mg/dL
NITRITE: POSITIVE — AB
Protein, ur: NEGATIVE mg/dL
SPECIFIC GRAVITY, URINE: 1.016 (ref 1.005–1.030)
pH: 7.5 (ref 5.0–8.0)

## 2015-07-02 LAB — URINE MICROSCOPIC-ADD ON

## 2015-07-02 LAB — PREGNANCY, URINE: Preg Test, Ur: NEGATIVE

## 2015-07-02 MED ORDER — ACETAMINOPHEN 325 MG PO TABS
650.0000 mg | ORAL_TABLET | Freq: Once | ORAL | Status: AC | PRN
  Administered 2015-07-02: 650 mg via ORAL
  Filled 2015-07-02: qty 2

## 2015-07-02 NOTE — ED Notes (Signed)
Patient states right kidney pain, pst three days. Daughter states patient may have had miscarriage. Patient gave urine sample.

## 2015-07-02 NOTE — ED Notes (Signed)
UTI symptoms with back pain.

## 2015-07-02 NOTE — ED Notes (Signed)
Patient states that she started to have lower back pain and kidney pain Friday. The patient reports that she also is having bleeding.

## 2015-07-03 MED ORDER — PROMETHAZINE HCL 25 MG PO TABS: 25.0000 mg | ORAL_TABLET | Freq: Four times a day (QID) | ORAL | Status: DC | PRN

## 2015-07-03 MED ORDER — CIPROFLOXACIN HCL 500 MG PO TABS: 500.0000 mg | ORAL_TABLET | Freq: Two times a day (BID) | ORAL | Status: DC

## 2015-07-03 MED ORDER — CIPROFLOXACIN HCL 500 MG PO TABS
500.0000 mg | ORAL_TABLET | Freq: Once | ORAL | Status: AC
  Administered 2015-07-03: 500 mg via ORAL
  Filled 2015-07-03: qty 1

## 2015-07-03 NOTE — ED Provider Notes (Signed)
CSN: [id number]     Arrival date & time 07/02/15  2220 History  By signing my name below, I, Evon Slackerrance Branch, attest that this documentation has been prepared under the direction and in the presence of No att. providers found. Electronically Signed: Evon Slackerrance Branch, ED Scribe. 07/03/2015. 7:04 AM.     Chief Complaint  Patient presents with  . Back Pain   Patient is a 44 y.o. female presenting with back pain. The history is provided by the patient. No language interpreter was used.  Back Pain Associated symptoms: dysuria   Associated symptoms: no fever    HPI Comments: Regina Thomas is a 44 y.o. female who presents to the Emergency Department complaining of low back pain onset 3 days prior. Pt report some myalgias, dysuria and slight dizziness today. Pt denies injury or trauma to the back. Pt doesn't report any medications or treatments PTA. Pt denies fever, nausea, vomiting, frequency or vaginal discharge. Pt does report vaginal bleeding but states that she has recently started her menstrual cycle. Pt reports Hx of recurrent UTI's within the last several months. Pt also reports Hx of kidney surgery as a child.     History reviewed. No pertinent past medical history. Past Surgical History  Procedure Laterality Date  . Tonsillectomy    . Appendectomy    . Kidney surgery     Family History  Problem Relation Age of Onset  . Hypertension Mother   . Diabetes Mother   . Hypertension Father   . Diabetes Father    Social History  Substance Use Topics  . Smoking status: Current Every Day Smoker  . Smokeless tobacco: None  . Alcohol Use: No   OB History    No data available      Review of Systems  Constitutional: Negative for fever.  Gastrointestinal: Negative for nausea and vomiting.  Genitourinary: Positive for dysuria. Negative for vaginal discharge.  Musculoskeletal: Positive for myalgias and back pain.  Neurological: Positive for dizziness.  All other systems reviewed and  are negative.    Allergies  Codeine; Hydrocodone; Percocet; and Zithromax  Home Medications   Prior to Admission medications   Medication Sig Start Date End Date Taking? Authorizing Provider  esomeprazole (NEXIUM) 20 MG packet Take 20 mg by mouth daily before breakfast.   Yes Historical Provider, MD  acetaminophen (TYLENOL) 500 MG tablet Take 1,000 mg by mouth every 6 (six) hours as needed.      Historical Provider, MD  cephALEXin (KEFLEX) 500 MG capsule Take 1 capsule (500 mg total) by mouth 4 (four) times daily. 04/24/15   Dione Boozeavid Glick, MD  ciprofloxacin (CIPRO) 500 MG tablet Take 1 tablet (500 mg total) by mouth 2 (two) times daily. 07/03/15   Benjiman CoreNathan Aveah Castell, MD  ibuprofen (ADVIL,MOTRIN) 200 MG tablet Take 400 mg by mouth every 6 (six) hours as needed.      Historical Provider, MD  metoCLOPramide (REGLAN) 10 MG tablet Take 1 tablet (10 mg total) by mouth every 6 (six) hours as needed. 04/24/15   Dione Boozeavid Glick, MD  ondansetron (ZOFRAN ODT) 8 MG disintegrating tablet 8mg  ODT q8 hours prn nausea 08/24/14   April Palumbo, MD  promethazine (PHENERGAN) 25 MG tablet Take 1 tablet (25 mg total) by mouth every 6 (six) hours as needed for nausea. 07/03/15   Benjiman CoreNathan Awa Bachicha, MD  traMADol (ULTRAM) 50 MG tablet Take 1 tablet (50 mg total) by mouth every 6 (six) hours as needed. 04/24/15   Dione Boozeavid Glick, MD   BP  112/73 mmHg  Pulse 79  Temp(Src) 100 F (37.8 C) (Oral)  Resp 20  Ht  (1.702 m)  Wt 150 lb (68.04 kg)  BMI 23.49 kg/m2  SpO2 100%  LMP 06/11/2015   Physical Exam  Constitutional: She is oriented to person, place, and time. She appears well-developed and well-nourished. No distress.  Appears uncomfortable but well appearing   HENT:  Head: Normocephalic and atraumatic.  Eyes: Conjunctivae and EOM are normal.  Neck: Neck supple. No tracheal deviation present.  Cardiovascular: Tachycardia present.   Pulmonary/Chest: Effort normal. No respiratory distress.  Abdominal: There is  tenderness (mild) in the right lower quadrant. There is CVA tenderness (right sided).  Musculoskeletal: Normal range of motion.  Neurological: She is alert and oriented to person, place, and time.  Skin: Skin is warm and dry.  Psychiatric: She has a normal mood and affect. Her behavior is normal.  Nursing note and vitals reviewed.   ED Course  Procedures (including critical care time) DIAGNOSTIC STUDIES: Oxygen Saturation is 100% on RA, normal by my interpretation.    COORDINATION OF CARE: 12:51 AM-Discussed treatment plan with pt at bedside and pt agreed to plan.     Labs Review Labs Reviewed  URINALYSIS, ROUTINE W REFLEX MICROSCOPIC (NOT AT [facility]) - Abnormal; Notable for the following:    APPearance CLOUDY (*)    Hgb urine dipstick LARGE (*)    Nitrite POSITIVE (*)    Leukocytes, UA SMALL (*)    All other components within normal limits  URINE MICROSCOPIC-ADD ON - Abnormal; Notable for the following:    Squamous Epithelial / LPF 0-5 (*)    Bacteria, UA MANY (*)    All other components within normal limits  URINE CULTURE  PREGNANCY, URINE    Imaging Review No results found. I have personally reviewed and evaluated these lab results as part of my medical decision-making.   EKG Interpretation None      MDM   Final diagnoses:  Pyelonephritis    Patient with flank pain and fevers. Has apparent urinary tract infection. Likely pyelonephritis. Has had UTIs in the past. Will need follow-up with appliance urology. Will discharge home.  I personally performed the services described in this documentation, which was scribed in my presence. The recorded information has been reviewed and is accurate.        Benjiman Core, MD 07/03/15 [phone]

## 2015-07-03 NOTE — Discharge Instructions (Signed)

## 2015-07-05 LAB — URINE CULTURE

## 2015-07-07 ENCOUNTER — Telehealth (HOSPITAL_COMMUNITY): Payer: Self-pay

## 2015-07-07 NOTE — Telephone Encounter (Signed)
Post ED Visit - Positive Culture Follow-up  Culture report reviewed by antimicrobial stewardship pharmacist:  []  Enzo BiNathan Batchelder, Pharm.D. []  Celedonio MiyamotoJeremy Frens, Pharm.D., BCPS [x]  Garvin FilaMike Maccia, Pharm.D. []  Georgina PillionElizabeth Martin, Pharm.D., BCPS []  RobbinsMinh Pham, [place]., BCPS, AAHIVP []  Estella HuskMichelle Turner, Pharm.D., BCPS, AAHIVP []  Tennis Mustassie Stewart, Pharm.D. []  Sherle Poeob Vincent, [place].  Positive urine culture, >/= 100,000 colonies -> E Coli Treated with Ciprofloxacin, organism sensitive to the same and no further patient follow-up is required at this time.  Arvid RightClark, [place] Seto Dorn 07/07/2015, 8:48 AM

## 2015-08-13 ENCOUNTER — Emergency Department (HOSPITAL_BASED_OUTPATIENT_CLINIC_OR_DEPARTMENT_OTHER): Payer: Self-pay

## 2015-08-13 ENCOUNTER — Encounter (HOSPITAL_BASED_OUTPATIENT_CLINIC_OR_DEPARTMENT_OTHER): Payer: Self-pay | Admitting: *Deleted

## 2015-08-13 ENCOUNTER — Emergency Department (HOSPITAL_BASED_OUTPATIENT_CLINIC_OR_DEPARTMENT_OTHER)
Admission: EM | Admit: 2015-08-13 | Discharge: 2015-08-13 | Disposition: A | Discharge status: Home / Self Care | Payer: Self-pay | Attending: Emergency Medicine | Admitting: Emergency Medicine

## 2015-08-13 DIAGNOSIS — G458 Other transient cerebral ischemic attacks and related syndromes: Secondary | ICD-10-CM

## 2015-08-13 DIAGNOSIS — I639 Cerebral infarction, unspecified: Secondary | ICD-10-CM

## 2015-08-13 DIAGNOSIS — F17200 Nicotine dependence, unspecified, uncomplicated: Secondary | ICD-10-CM | POA: Insufficient documentation

## 2015-08-13 DIAGNOSIS — R200 Anesthesia of skin: Secondary | ICD-10-CM

## 2015-08-13 LAB — COMPREHENSIVE METABOLIC PANEL
ALK PHOS: 78 U/L (ref 38–126)
ALT: 11 U/L — AB (ref 14–54)
AST: 24 U/L (ref 15–41)
Albumin: 4 g/dL (ref 3.5–5.0)
Anion gap: 7 (ref 5–15)
BUN: 7 mg/dL (ref 6–20)
CALCIUM: 8.3 mg/dL — AB (ref 8.9–10.3)
CHLORIDE: 107 mmol/L (ref 101–111)
CO2: 24 mmol/L (ref 22–32)
CREATININE: 0.63 mg/dL (ref 0.44–1.00)
Glucose, Bld: 103 mg/dL — ABNORMAL HIGH (ref 65–99)
Potassium: 3.7 mmol/L (ref 3.5–5.1)
Sodium: 138 mmol/L (ref 135–145)
Total Bilirubin: 0.4 mg/dL (ref 0.3–1.2)
Total Protein: 6.8 g/dL (ref 6.5–8.1)

## 2015-08-13 LAB — DIFFERENTIAL
BASOS ABS: 0 10*3/uL (ref 0.0–0.1)
BASOS PCT: 0 %
Eosinophils Absolute: 0.1 10*3/uL (ref 0.0–0.7)
Eosinophils Relative: 1 %
LYMPHS PCT: 13 %
Lymphs Abs: 1.3 10*3/uL (ref 0.7–4.0)
MONO ABS: 0.7 10*3/uL (ref 0.1–1.0)
MONOS PCT: 7 %
NEUTROS ABS: 8.5 10*3/uL — AB (ref 1.7–7.7)
Neutrophils Relative %: 79 %

## 2015-08-13 LAB — CBC
HEMATOCRIT: 34.1 % — AB (ref 36.0–46.0)
HEMOGLOBIN: 10.6 g/dL — AB (ref 12.0–15.0)
MCH: 26.8 pg (ref 26.0–34.0)
MCHC: 31.1 g/dL (ref 30.0–36.0)
MCV: 86.1 fL (ref 78.0–100.0)
Platelets: 266 10*3/uL (ref 150–400)
RBC: 3.96 MIL/uL (ref 3.87–5.11)
RDW: 14.2 % (ref 11.5–15.5)
WBC: 10.6 10*3/uL — AB (ref 4.0–10.5)

## 2015-08-13 LAB — CBG MONITORING, ED: Glucose-Capillary: 104 mg/dL — ABNORMAL HIGH (ref 65–99)

## 2015-08-13 LAB — TROPONIN I: Troponin I: <0.03 ng/mL (ref <0.031)

## 2015-08-13 LAB — PROTIME-INR
INR: 0.93 (ref 0.00–1.49)
Prothrombin Time: 12.7 seconds (ref 11.6–15.2)

## 2015-08-13 LAB — APTT: APTT: 34 s (ref 24–37)

## 2015-08-13 MED ORDER — ASPIRIN 81 MG PO CHEW
324.0000 mg | CHEWABLE_TABLET | Freq: Once | ORAL | Status: AC
  Administered 2015-08-13: 324 mg via ORAL
  Filled 2015-08-13: qty 4

## 2015-08-13 MED ORDER — IOHEXOL 350 MG/ML SOLN
100.0000 mL | Freq: Once | INTRAVENOUS | Status: AC | PRN
  Administered 2015-08-13: 100 mL via INTRAVENOUS

## 2015-08-13 NOTE — ED Notes (Signed)
Patient remains in CT 

## 2015-08-13 NOTE — ED Notes (Signed)
Pt reports at 1410 she had sudden onset of left sided numbness.  Pt is noted to have left arm drift.  Pt denies pain.  No speech slurred.  Sensation on left side is dulled. Pt is ambulatory.

## 2015-08-13 NOTE — ED Notes (Signed)
Dr. Fayrene Fearing in to discuss plan of care with pt. Pt is resistant to admission to the hospital for stroke workup. Daughter is encouraging pt to agree to admission. Dr. Fayrene Fearing explained risks of declining admission including recurring sx that would be untreatable, hemorrhage, and possible death. Dr. Fayrene Fearing and this RN stepped out to allow pt and daughter to discuss.

## 2015-08-13 NOTE — ED Provider Notes (Signed)
CSN: [id number]     Arrival date & time 08/13/15  1440 History   First MD Initiated Contact with Patient 08/13/15 1446     Chief Complaint  Patient presents with  . Code Stroke    An emergency department physician performed an initial assessment on this suspected stroke patient at 102. I left the care of the patient to examine this patient at triage once or nursing staff acutely and astutely noted that this patient was having acute stroke symptoms. I examined her at 24.  Rest time no normal 1414 p.m. HPI  She presents to come in by her daughter. She states that at 2:14 section and she started feeling like the left side of her face was numb. Continued with feeling of numbness in her left arm and left leg. States that it is painful and difficult to use the left leg to walk. The pain is chronic. Feels as though he might be slightly weak.  History reviewed. No pertinent past medical history. Past Surgical History  Procedure Laterality Date  . Tonsillectomy    . Appendectomy    . Kidney surgery     Family History  Problem Relation Age of Onset  . Hypertension Mother   . Diabetes Mother   . Hypertension Father   . Diabetes Father    Social History  Substance Use Topics  . Smoking status: Current Every Day Smoker  . Smokeless tobacco: None  . Alcohol Use: No   OB History    No data available     Review of Systems  Constitutional: Negative for fever, chills, diaphoresis, appetite change and fatigue.  HENT: Negative for mouth sores, sore throat and trouble swallowing.   Eyes: Negative for visual disturbance.  Respiratory: Negative for cough, chest tightness, shortness of breath and wheezing.   Cardiovascular: Negative for chest pain.  Gastrointestinal: Negative for nausea, vomiting, abdominal pain, diarrhea and abdominal distention.  Endocrine: Negative for polydipsia, polyphagia and polyuria.  Genitourinary: Negative for dysuria, frequency and hematuria.  Musculoskeletal:  Negative for gait problem.  Skin: Negative for color change, pallor and rash.  Neurological: Positive for weakness and numbness. Negative for dizziness, syncope, light-headedness and headaches.  Hematological: Does not bruise/bleed easily.  Psychiatric/Behavioral: Negative for behavioral problems and confusion.      Allergies  Codeine; Hydrocodone; Percocet; and Zithromax  Home Medications   Prior to Admission medications   Not on File   BP 133/86 mmHg  Pulse 77  Temp(Src) 98.3 F (36.8 C) (Oral)  Resp 18  Ht  (1.702 m)  Wt 153 lb (69.4 kg)  BMI 23.96 kg/m2  SpO2 98% Physical Exam  Constitutional: She is oriented to person, place, and time. She appears well-developed and well-nourished. No distress.  HENT:  Head: Normocephalic.  Eyes: Conjunctivae are normal. Pupils are equal, round, and reactive to light. No scleral icterus.  Neck: Normal range of motion. Neck supple. No thyromegaly present.  Cardiovascular: Normal rate and regular rhythm.  Exam reveals no gallop and no friction rub.   No murmur heard. Pulmonary/Chest: Effort normal and breath sounds normal. No respiratory distress. She has no wheezes. She has no rales.  Abdominal: Soft. Bowel sounds are normal. She exhibits no distension. There is no tenderness. There is no rebound.  Musculoskeletal: Normal range of motion.  Neurological: She is alert and oriented to person, place, and time.  NIH initially 2. Left facial and left arm numbness. Has left pronator drift. Normal in upper and right lower externa. No  facial droop. No ocular palsies. No visual field deficits.  Skin: Skin is warm and dry. No rash noted.  Psychiatric: She has a normal mood and affect. Her behavior is normal.    ED Course  Procedures (including critical care time) Labs Review Labs Reviewed  CBC - Abnormal; Notable for the following:    WBC 10.6 (*)    Hemoglobin 10.6 (*)    HCT 34.1 (*)    All other components within normal limits   DIFFERENTIAL - Abnormal; Notable for the following:    Neutro Abs 8.5 (*)    All other components within normal limits  COMPREHENSIVE METABOLIC PANEL - Abnormal; Notable for the following:    Glucose, Bld 103 (*)    Calcium 8.3 (*)    ALT 11 (*)    All other components within normal limits  CBG MONITORING, ED - Abnormal; Notable for the following:    Glucose-Capillary 104 (*)    All other components within normal limits  PROTIME-INR  APTT  TROPONIN I    Imaging Review Ct Angio Head W/cm &/or Wo Cm  08/13/2015  CLINICAL DATA:  45 year old female with sudden onset headache, left upper extremity numbness. Code stroke. Initial encounter. EXAM: CT ANGIOGRAPHY HEAD TECHNIQUE: Multidetector CT imaging of the head was performed using the standard protocol during bolus administration of intravenous contrast. Multiplanar CT image reconstructions and MIPs were obtained to evaluate the vascular anatomy. CONTRAST:  OMNIPAQUE IOHEXOL 350 MG/ML SOLN COMPARISON:  None. FINDINGS: CT HEAD Brain: Cerebral volume is normal. No midline shift, ventriculomegaly, mass effect, evidence of mass lesion, intracranial hemorrhage or evidence of cortically based acute infarction. Gray-white matter differentiation is within normal limits throughout the brain. Calvarium and skull base: No osseous abnormality identified. Paranasal sinuses: Visualized paranasal sinuses and mastoids are clear. Orbits: Visualized orbits and scalp soft tissues are within normal limits. CTA HEAD Posterior circulation: Vertebral artery V4 segments appear normal. Both PICA origins are patent. Patent vertebrobasilar junction. No basilar artery stenosis. Normal SCA and PCA origins. Both posterior communicating arteries are present, the right is diminutive. Normal PCA branches. Anterior circulation: Negative distal cervical ICAs. Normal left ICA siphons; the left ophthalmic artery has a slightly unusual more lateral origin (series 8, image 29). Normal  left posterior communicating artery origin. Normal right ICA siphon. The right ophthalmic artery origin is also somewhat lateral. Normal carotid termini. Normal MCA and ACA origins. Diminutive anterior communicating artery. Normal bilateral ACA branches. Left MCA origin, M1 segment, trifurcation and left MCA branches are within normal limits. Right MCA origin, M1 segment, trifurcation and right MCA branches are within normal limits. Venous sinuses: Patent. Anatomic variants: Lateral origins of the ophthalmic artery is, more so the left. Delayed phase:No abnormal enhancement identified. IMPRESSION: Negative for intracranial emergent large vessel occlusion. Negative intracranial CTA. Normal CT appearance of the brain. Electronically Signed   By: Odessa Fleming M.D.   On: 08/13/2015 15:37   I have personally reviewed and evaluated these images and lab results as part of my medical decision-making.   EKG Interpretation None      MDM   Final diagnoses:  Lt arm numbness  Other specified transient cerebral ischemias  Cerebral infarction due to unspecified mechanism    Social studies negative. I discussed the case with Dr. Amada Jupiter of neurology. I described the patient with an NIH of 2. Urine agreement the patient's deficits were not disabling to the point of requiring the risk of IV TPA. I discussed this with the  patient. She is in agreement.  On recheck her symptoms are improving. I strongly encouraged that she stay in the hospital for further workup for TIA. I stressed the risk of completed stroke within the first 24-48 hours, as well as the first 2 weeks after initial TIA. She declines. She expresses understanding of the risks including death and permanent disability requiring prolonged hospitalization. Her daughter is in the room during this conversation. I have referred the patient to neurology for completion of her outpatient workup. Advised to take an aspirin daily. Encouraged her to recheck with any  worsening or recurrence of her symptoms    Rolland Porter, MD 08/13/15 [phone]

## 2015-08-13 NOTE — ED Notes (Signed)
CBG 104 

## 2015-08-13 NOTE — ED Notes (Signed)
Pt has been updated with results and is adamantly refusing admission. States she will have follow-up diagnostics done as an outpatient. Pt has been explained the risks of leaving AMA and has signed the AMA form.

## 2015-08-13 NOTE — Discharge Instructions (Signed)
Dr. Fayrene Fearing has recommended used a in the hospital for further testing to prevent strokes. You are at high risk for completed stroke in the next several weeks. You should absolutely stop smoking. Take a full aspirin every day. Return to emergency with any time with recurrence of your symptoms.   Transient Ischemic Attack A transient ischemic attack (TIA) is a "warning stroke" that causes stroke-like symptoms. Unlike a stroke, a TIA does not cause permanent damage to the brain. The symptoms of a TIA can happen very fast and do not last long. It is important to know the symptoms of a TIA and what to do. This can help prevent a major stroke or death. CAUSES  A TIA is caused by a temporary blockage in an artery in the brain or neck (carotid artery). The blockage does not allow the brain to get the blood supply it needs and can cause different symptoms. The blockage can be caused by either:  A blood clot.  Fatty buildup (plaque) in a neck or brain artery. RISK FACTORS  High blood pressure (hypertension).  High cholesterol.  Diabetes mellitus.  Heart disease.  The buildup of plaque in the blood vessels (peripheral artery disease or atherosclerosis).  The buildup of plaque in the blood vessels that provide blood and oxygen to the brain (carotid artery stenosis).  An abnormal heart rhythm (atrial fibrillation).  Obesity.  Using any tobacco products, including cigarettes, chewing tobacco, or electronic cigarettes.  Taking oral contraceptives, especially in combination with using tobacco.  Physical inactivity.  A diet high in fats, salt (sodium), and calories.  Excessive alcohol use.  Use of illegal drugs (especially cocaine and methamphetamine).  Being female.  Being African American.  Being over the age of 35 years.  Family history of stroke.  Previous history of blood clots, stroke, TIA, or heart attack.  Sickle cell disease. SIGNS AND SYMPTOMS  TIA symptoms are the same  as a stroke but are temporary. These symptoms usually develop suddenly, or may be newly present upon waking from sleep:  Sudden weakness or numbness of the face, arm, or leg, especially on one side of the body.  Sudden trouble walking or difficulty moving arms or legs.  Sudden confusion.  Sudden personality changes.  Trouble speaking (aphasia) or understanding.  Difficulty swallowing.  Sudden trouble seeing in one or both eyes.  Double vision.  Dizziness.  Loss of balance or coordination.  Sudden severe headache with no known cause.  Trouble reading or writing.  Loss of bowel or bladder control.  Loss of consciousness. DIAGNOSIS  Your health care provider may be able to determine the presence or absence of a TIA based on your symptoms, history, and physical exam. CT scan of the brain is usually performed to help identify a TIA. Other tests may include:  Electrocardiography (ECG).  Continuous heart monitoring.  Echocardiography.  Carotid ultrasonography.  MRI.  A scan of the brain circulation.  Blood tests. TREATMENT  Since the symptoms of TIA are the same as a stroke, it is important to seek treatment as soon as possible. You may need a medicine to dissolve a blood clot (thrombolytic) if that is the cause of the TIA. This medicine cannot be given if too much time has passed. Treatment may also include:   Rest, oxygen, fluids through an IV tube, and medicines to thin the blood (anticoagulants).  Measures will be taken to prevent short-term and long-term complications, including infection from breathing foreign material into the lungs (aspiration pneumonia),  blood clots in the legs, and falls.  Procedures to either remove plaque in the carotid arteries or dilate carotid arteries that have narrowed due to plaque. Those procedures are:  Carotid endarterectomy.  Carotid angioplasty and stenting.  Medicines and diet may be used to address diabetes, high blood  pressure, and other underlying risk factors. HOME CARE INSTRUCTIONS   Take medicines only as directed by your health care provider. Follow the directions carefully. Medicines may be used to control risk factors for a stroke. Be sure you understand all your medicine instructions.  You may be told to take aspirin or the anticoagulant warfarin. Warfarin needs to be taken exactly as instructed.  Taking too much or too little warfarin is dangerous. Too much warfarin increases the risk of bleeding. Too little warfarin continues to allow the risk for blood clots. While taking warfarin, you will need to have regular blood tests to measure your blood clotting time. A PT blood test measures how long it takes for blood to clot. Your PT is used to calculate another value called an INR. Your PT and INR help your health care provider to adjust your dose of warfarin. The dose can change for many reasons. It is critically important that you take warfarin exactly as prescribed.  Many foods, especially foods high in vitamin K can interfere with warfarin and affect the PT and INR. Foods high in vitamin K include spinach, kale, broccoli, cabbage, collard and turnip greens, Brussels sprouts, peas, cauliflower, seaweed, and parsley, as well as beef and pork liver, green tea, and soybean oil. You should eat a consistent amount of foods high in vitamin K. Avoid major changes in your diet, or notify your health care provider before changing your diet. Arrange a visit with a dietitian to answer your questions.  Many medicines can interfere with warfarin and affect the PT and INR. You must tell your health care provider about any and all medicines you take; this includes all vitamins and supplements. Be especially cautious with aspirin and anti-inflammatory medicines. Do not take or discontinue any prescribed or over-the-counter medicine except on the advice of your health care provider or pharmacist.  Warfarin can have side  effects, such as excessive bruising or bleeding. You will need to hold pressure over cuts for longer than usual. Your health care provider or pharmacist will discuss other potential side effects.  Avoid sports or activities that may cause injury or bleeding.  Be careful when shaving, flossing your teeth, or handling sharp objects.  Alcohol can change the body's ability to handle warfarin. It is best to avoid alcoholic drinks or consume only very small amounts while taking warfarin. Notify your health care provider if you change your alcohol intake.  Notify your dentist or other health care providers before procedures.  Eat a diet that includes 5 or more servings of fruits and vegetables each day. This may reduce the risk of stroke. Certain diets may be prescribed to address high blood pressure, high cholesterol, diabetes, or obesity.  A diet low in sodium, saturated fat, trans fat, and cholesterol is recommended to manage high blood pressure.  A diet low in saturated fat, trans fat, and cholesterol, and high in fiber may control cholesterol levels.  A controlled-carbohydrate, controlled-sugar diet is recommended to manage diabetes.  A reduced-calorie diet that is low in sodium, saturated fat, trans fat, and cholesterol is recommended to manage obesity.  Maintain a healthy weight.  Stay physically active. It is recommended that you get at  least 30 minutes of activity on most or all days.  Do not use any tobacco products, including cigarettes, chewing tobacco, or electronic cigarettes. If you need help quitting, ask your health care provider.  Limit alcohol intake to no more than 1 drink per day for nonpregnant women and 2 drinks per day for men. One drink equals 12 ounces of beer, 5 ounces of wine, or 1 ounces of hard liquor.  Do not abuse drugs.  A safe home environment is important to reduce the risk of falls. Your health care provider may arrange for specialists to evaluate your  home. Having grab bars in the bedroom and bathroom is often important. Your health care provider may arrange for equipment to be used at home, such as raised toilets and a seat for the shower.  Follow all instructions for follow-up with your health care provider. This is very important. This includes any referrals and lab tests. Proper follow-up can prevent a stroke or another TIA from occurring. PREVENTION  The risk of a TIA can be decreased by appropriately treating high blood pressure, high cholesterol, diabetes, heart disease, and obesity, and by quitting smoking, limiting alcohol, and staying physically active. SEEK MEDICAL CARE IF:  You have personality changes.  You have difficulty swallowing.  You are seeing double.  You have dizziness.  You have a fever. SEEK IMMEDIATE MEDICAL CARE IF:  Any of the following symptoms may represent a serious problem that is an emergency. Do not wait to see if the symptoms will go away. Get medical help right away. Call your local emergency services (911 in [place] Do not drive yourself to the hospital.  You have sudden weakness or numbness of the face, arm, or leg, especially on one side of the body.  You have sudden trouble walking or difficulty moving arms or legs.  You have sudden confusion.  You have trouble speaking (aphasia) or understanding.  You have sudden trouble seeing in one or both eyes.  You have a loss of balance or coordination.  You have a sudden, severe headache with no known cause.  You have new chest pain or an irregular heartbeat.  You have a partial or total loss of consciousness. MAKE SURE YOU:   Understand these instructions.  Will watch your condition.  Will get help right away if you are not doing well or get worse.   This information is not intended to replace advice given to you by your health care provider. Make sure you discuss any questions you have with your health care provider.   Document Released:  04/03/2005 Document Revised: 07/15/2014 Document Reviewed: 09/29/2013 Elsevier Interactive Patient Education 2016 [employer].  Stroke Prevention Some medical conditions and behaviors are associated with an increased chance of having a stroke. You may prevent a stroke by making healthy choices and managing medical conditions. HOW CAN I REDUCE MY RISK OF HAVING A STROKE?   Stay physically active. Get at least 30 minutes of activity on most or all days.  Do not smoke. It may also be helpful to avoid exposure to secondhand smoke.  Limit alcohol use. Moderate alcohol use is considered to be:  No more than 2 drinks per day for men.  No more than 1 drink per day for nonpregnant women.  Eat healthy foods. This involves:  Eating 5 or more servings of fruits and vegetables a day.  Making dietary changes that address high blood pressure (hypertension), high cholesterol, diabetes, or obesity.  Manage your cholesterol levels.  Making food choices that are high in fiber and low in saturated fat, trans fat, and cholesterol may control cholesterol levels.  Take any prescribed medicines to control cholesterol as directed by your health care provider.  Manage your diabetes.  Controlling your carbohydrate and sugar intake is recommended to manage diabetes.  Take any prescribed medicines to control diabetes as directed by your health care provider.  Control your hypertension.  Making food choices that are low in salt (sodium), saturated fat, trans fat, and cholesterol is recommended to manage hypertension.  Ask your health care provider if you need treatment to lower your blood pressure. Take any prescribed medicines to control hypertension as directed by your health care provider.  If you are 79-29 years of age, have your blood pressure checked every 3-5 years. If you are 3 years of age or older, have your blood pressure checked every year.  Maintain a healthy weight.  Reducing calorie  intake and making food choices that are low in sodium, saturated fat, trans fat, and cholesterol are recommended to manage weight.  Stop drug abuse.  Avoid taking birth control pills.  Talk to your health care provider about the risks of taking birth control pills if you are over 53 years old, smoke, get migraines, or have ever had a blood clot.  Get evaluated for sleep disorders (sleep apnea).  Talk to your health care provider about getting a sleep evaluation if you snore a lot or have excessive sleepiness.  Take medicines only as directed by your health care provider.  For some people, aspirin or blood thinners (anticoagulants) are helpful in reducing the risk of forming abnormal blood clots that can lead to stroke. If you have the irregular heart rhythm of atrial fibrillation, you should be on a blood thinner unless there is a good reason you cannot take them.  Understand all your medicine instructions.  Make sure that other conditions (such as anemia or atherosclerosis) are addressed. SEEK IMMEDIATE MEDICAL CARE IF:   You have sudden weakness or numbness of the face, arm, or leg, especially on one side of the body.  Your face or eyelid droops to one side.  You have sudden confusion.  You have trouble speaking (aphasia) or understanding.  You have sudden trouble seeing in one or both eyes.  You have sudden trouble walking.  You have dizziness.  You have a loss of balance or coordination.  You have a sudden, severe headache with no known cause.  You have new chest pain or an irregular heartbeat. Any of these symptoms may represent a serious problem that is an emergency. Do not wait to see if the symptoms will go away. Get medical help at once. Call your local emergency services (911 in [place] Do not drive yourself to the hospital.   This information is not intended to replace advice given to you by your health care provider. Make sure you discuss any questions you have  with your health care provider.   Document Released: 08/01/2004 Document Revised: 07/15/2014 Document Reviewed: 12/25/2012 Elsevier Interactive Patient Education [employer]

## 2015-08-13 NOTE — ED Notes (Signed)
Dr. James at bedside  

## 2015-08-15 ENCOUNTER — Ambulatory Visit (INDEPENDENT_AMBULATORY_CARE_PROVIDER_SITE_OTHER): Payer: Self-pay | Admitting: Diagnostic Neuroimaging

## 2015-08-15 ENCOUNTER — Encounter: Payer: Self-pay | Admitting: Diagnostic Neuroimaging

## 2015-08-15 ENCOUNTER — Encounter: Payer: Self-pay | Admitting: *Deleted

## 2015-08-15 VITALS — BP 122/69 | HR 74 | Ht 67.0 in | Wt 140.6 lb

## 2015-08-15 DIAGNOSIS — I6349 Cerebral infarction due to embolism of other cerebral artery: Secondary | ICD-10-CM

## 2015-08-15 NOTE — Progress Notes (Signed)
[facility]  PATIENT: Regina Thomas DOB: October 02, 1970  REFERRING CLINICIAN: ER  HISTORY FROM: patient and daughter  REASON FOR VISIT: new consult    HISTORICAL  CHIEF COMPLAINT:  Chief Complaint  Patient presents with  . Transient Ischemic Attack    rm 6, New Patient, dgtrDuwayne Heck, "08/13/15 had mini-stroke, left side of face nubm, numbness of left arm/leg, headache since"    HISTORY OF PRESENT ILLNESS:   45 year old right-handed female here for evaluation of TIA. 08/13/15 patient had onset of left face numbness and weakness progressing to her left arm and left leg. Symptoms slightly improved over 20-30 minutes. Patient went to the emergency room for evaluation. Initially code stroke was activated but IV TPA was not given as patient's symptoms were significantly improving. Patient was encouraged to stay in the hospital for TIA workup. However she declined and agreed to follow-up in clinic for expedited workup with me today.  Patient does not have history of migraine. However at onset of symptoms patient had headache radiating to left face. She had some nausea without vomiting. Headache is described as stabbing and aching sensation. No sensitivity to light or sound.  Patient smokes cigarettes 1 pack every 2 days. She drinks alcohol occasionally. No known hypertension, diabetes, cholesterol. Patient now taking aspirin 81 mg daily since ER visit.  No prior similar symptoms. No recurrence of symptoms.   REVIEW OF SYSTEMS: Full 14 system review of systems performed and notable only for not asleep decreased energy change in appetite achy muscles and Cold Anemia Headache Dizziness I Pain Fatigue.  ALLERGIES: Allergies  Allergen Reactions  . Codeine Nausea And Vomiting  . Hydrocodone Nausea And Vomiting  . Percocet [Oxycodone-Acetaminophen] Nausea And Vomiting  . Zithromax [Azithromycin] Palpitations    zpack    HOME MEDICATIONS: No outpatient prescriptions prior  to visit.   No facility-administered medications prior to visit.    PAST MEDICAL HISTORY: History reviewed. No pertinent past medical history.  PAST SURGICAL HISTORY: Past Surgical History  Procedure Laterality Date  . Tonsillectomy  1976  . Appendectomy  1973  . Kidney surgery  1973  . Cesarean section  1993, 1997  . Inner ear surgery      tubes     FAMILY HISTORY: Family History  Problem Relation Age of Onset  . Hypertension Mother   . Diabetes Mother   . Hypertension Father   . Diabetes Father   . Cancer Father     melanoma  . Cancer Maternal Grandmother     lung  . Heart attack Maternal Grandfather   . Stroke Maternal Grandfather     SOCIAL HISTORY:  Social History   Social History  . Marital Status: Legally Separated    Spouse Name: N/A  . Number of Children: 2  . 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 Smoking status: Current Every Day Smoker -- 0.50 packs/day for 15 years  . Smokeless tobacco: Not on file  . Alcohol Use: Yes     Comment: socially  . Drug Use: No  . Sexual Activity: Yes    Birth Control/ Protection: None   Other Topics Concern  . Not on file   Social History Narrative   Lives at home with dgtr, son-in-law, babies   Caffeine use- coffee, 1 cup daily; tea, 4 glasses daily     PHYSICAL EXAM  GENERAL EXAM/CONSTITUTIONAL: Vitals:  Filed  Vitals:   08/15/15 1015  BP: 122/69  Pulse: 74  Height:  (1.702 m)  Weight: 140 lb 9.6 oz (63.776 kg)     Body mass index is 22.02 kg/(m^2).  Visual Acuity Screening   Right eye Left eye Both eyes  Without correction:     With correction: 20/30 20/30      Patient is in no distress; well developed, nourished and groomed; neck is supple  CARDIOVASCULAR:  Examination of carotid arteries is normal; no carotid bruits  Regular rate and rhythm, no murmurs  Examination of peripheral vascular system by observation and  palpation is normal  EYES:  Ophthalmoscopic exam of optic discs and posterior segments is normal; no papilledema or hemorrhages  MUSCULOSKELETAL:  Gait, strength, tone, movements noted in Neurologic exam below  NEUROLOGIC: MENTAL STATUS:  No flowsheet data found.  awake, alert, oriented to person, place and time  recent and remote memory intact  normal attention and concentration  language fluent, comprehension intact, naming intact,   fund of knowledge appropriate  CRANIAL NERVE:   2nd - no papilledema on fundoscopic exam  2nd, 3rd, 4th, 6th - pupils equal and reactive to light, visual fields full to confrontation, extraocular muscles intact, no nystagmus  5th - facial sensation --> DECR LT IN LEFT FACE (V1)  7th - facial strength symmetric  8th - hearing intact  9th - palate elevates symmetrically, uvula midline  11th - shoulder shrug symmetric  12th - tongue protrusion midline  MOTOR:   normal bulk and tone, full strength in the BUE, BLE  SENSORY:   normal and symmetric to light touch, pinprick, temperature, vibration; EXCEPT DECR VIB IN LEFT HAND  COORDINATION:   finger-nose-finger, fine finger movements normal  REFLEXES:   deep tendon reflexes present and symmetric; EXCEPT SLIGHTLY INCREASED IN LEFT ANKLE  GAIT/STATION:   narrow based gait; able to walk on toes, heels and tandem; romberg is negative    DIAGNOSTIC DATA (LABS, IMAGING, TESTING) - I reviewed patient records, labs, notes, testing and imaging myself where available.  Lab Results  Component Value Date   WBC 10.6* 08/13/2015   HGB 10.6* 08/13/2015   HCT 34.1* 08/13/2015   MCV 86.1 08/13/2015   PLT 266 08/13/2015      Component Value Date/Time   NA 138 08/13/2015 1505   K 3.7 08/13/2015 1505   CL 107 08/13/2015 1505   CO2 24 08/13/2015 1505   GLUCOSE 103* 08/13/2015 1505   BUN 7 08/13/2015 1505   CREATININE 0.63 08/13/2015 1505   CALCIUM 8.3* 08/13/2015 1505   PROT 6.8  08/13/2015 1505   ALBUMIN 4.0 08/13/2015 1505   AST 24 08/13/2015 1505   ALT 11* 08/13/2015 1505   ALKPHOS 78 08/13/2015 1505   BILITOT 0.4 08/13/2015 1505   GFRNONAA >60 08/13/2015 1505   GFRAA >60 08/13/2015 1505   No results found for: CHOL, HDL, LDLCALC, LDLDIRECT, TRIG, CHOLHDL No results found for: AVWU9W No results found for: VITAMINB12 No results found for: TSH   08/13/15 CTA head [I reviewed images myself and agree with interpretation. -VRP]  - no occlusion or stenoses; no acute findings    ASSESSMENT AND PLAN  45 y.o. year old female here with new onset transient left face, arm, leg numbness and weakness. Patient with some mild residual sensory changes in the left side in the face and arm. Likely represents small right brain subcortical ischemic infarction. We'll complete workup.  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 MR Brain Wo Contrast  . Hemoglobin A1c  . Lipid Panel  . Ambulatory referral to Sleep Studies  . ECHOCARDIOGRAM COMPLETE   Return in about 6 weeks (around 09/26/2015).  I reviewed images, labs, notes, records myself. I summarized findings and reviewed with patient, for this high risk condition (TIA vs stroke) requiring high complexity decision making.    Suanne Marker, MD 08/15/2015, 10:49 AM Certified in Neurology, Neurophysiology and Neuroimaging  [facility] [address], [address] [place], [place] [address] [phone]

## 2015-08-15 NOTE — Patient Instructions (Signed)
Thank you for coming to see [place] at [facility]. I hope we have been able to provide you high quality care today.  You may receive a patient satisfaction survey over the next few weeks. We would appreciate your feedback and comments so that we may continue to improve ourselves and the health of our patients.  - I will check lab tests, MRI, ultrasound of neck and heart, sleep study - try to reduce and stop smoking cigarettes - follow up with primary care physician - continue aspirin 34m daily   ~~~~~~~~~~~~~~~~~~~~~~~~~~~~~~~~~~~~~~~~~~~~~~~~~~~~~~~~~~~~~~~~~  DR. PENUMALLI'S GUIDE TO HAPPY AND HEALTHY LIVING These are some of my general health and wellness recommendations. Some of them may apply to you better than others. Please use common sense as you try these suggestions and feel free to ask me any questions.   ACTIVITY/FITNESS Mental, social, emotional and physical stimulation are very important for brain and body health. Try learning a new activity (arts, music, language, sports, games).  Keep moving your body to the best of your abilities. You can do this at home, inside or outside, the park, community center, gym or anywhere you like. Consider a physical therapist or personal trainer to get started. Consider the app Sworkit. Fitness trackers such as smart-watches, smart-phones or Fitbits can help as well.   NUTRITION Eat more plants: colorful vegetables, nuts, seeds and berries.  Eat less sugar, salt, preservatives and processed foods.  Avoid toxins such as cigarettes and alcohol.  Drink water when you are thirsty. Warm water with a slice of lemon is an excellent morning drink to start the day.  Consider these websites for more information The Nutrition Source (hhttps://www.henry-hernandez.biz/ [employer] (wWindowBlog.ch   RELAXATION Consider practicing mindfulness meditation or other relaxation  techniques such as deep breathing, prayer, yoga, tai chi, massage. See website mindful.org or the apps Headspace or Calm to help get started.   SLEEP Try to get at least 7-8+ hours sleep per day. Regular exercise and reduced caffeine will help you sleep better. Practice good sleep hygeine techniques. See website sleep.org for more information.   PLANNING Prepare estate planning, living will, healthcare POA documents. Sometimes this is best planned with the help of an attorney. Theconversationproject.org and agingwithdignity.org are excellent resources.

## 2015-08-16 ENCOUNTER — Telehealth: Payer: Self-pay | Admitting: *Deleted

## 2015-08-16 LAB — LIPID PANEL
CHOLESTEROL TOTAL: 192 mg/dL (ref 100–199)
Chol/HDL Ratio: 3.6 ratio units (ref 0.0–4.4)
HDL: 53 mg/dL (ref >39)
LDL Calculated: 121 mg/dL — ABNORMAL HIGH (ref 0–99)
TRIGLYCERIDES: 88 mg/dL (ref 0–149)
VLDL CHOLESTEROL CAL: 18 mg/dL (ref 5–40)

## 2015-08-16 LAB — HEMOGLOBIN A1C
ESTIMATED AVERAGE GLUCOSE: 117 mg/dL
Hgb A1c MFr Bld: 5.7 % — ABNORMAL HIGH (ref 4.8–5.6)

## 2015-08-16 MED ORDER — ATORVASTATIN CALCIUM 20 MG PO TABS: 20.0000 mg | ORAL_TABLET | Freq: Every day | ORAL | Status: DC

## 2015-08-16 NOTE — Telephone Encounter (Signed)
Spoke with patient and informed her that labs are unremarkable except LDL is elevated. Informed her that Dr Marjory Lies ordered Atorvastatin, e scribed to her pharmacy. Advised she begin medication today and FU with her PCP for management. She verbalized understanding, appreciation.

## 2015-08-20 ENCOUNTER — Ambulatory Visit
Admission: RE | Admit: 2015-08-20 | Discharge: 2015-08-20 | Disposition: A | Discharge status: Home / Self Care | Payer: No Typology Code available for payment source | Source: Ambulatory Visit | Attending: Diagnostic Neuroimaging | Admitting: Diagnostic Neuroimaging

## 2015-08-20 DIAGNOSIS — I6349 Cerebral infarction due to embolism of other cerebral artery: Secondary | ICD-10-CM

## 2015-08-22 ENCOUNTER — Telehealth: Payer: Self-pay

## 2015-08-22 ENCOUNTER — Institutional Professional Consult (permissible substitution): Payer: Self-pay | Admitting: Neurology

## 2015-08-22 NOTE — Telephone Encounter (Signed)
Pt did not show for their appt with Dr. Dohmeier today.  

## 2015-08-23 ENCOUNTER — Encounter: Payer: Self-pay | Admitting: Neurology

## 2015-08-29 ENCOUNTER — Ambulatory Visit (HOSPITAL_COMMUNITY): Admission: RE | Admit: 2015-08-29 | Payer: Self-pay | Source: Ambulatory Visit

## 2015-08-29 ENCOUNTER — Ambulatory Visit (HOSPITAL_COMMUNITY): Payer: Self-pay

## 2015-09-01 ENCOUNTER — Ambulatory Visit (HOSPITAL_COMMUNITY): Payer: Self-pay

## 2015-09-01 ENCOUNTER — Ambulatory Visit (HOSPITAL_COMMUNITY): Admission: RE | Admit: 2015-09-01 | Payer: Self-pay | Source: Ambulatory Visit

## 2015-09-04 ENCOUNTER — Ambulatory Visit (HOSPITAL_COMMUNITY): Admission: RE | Admit: 2015-09-04 | Payer: Self-pay | Source: Ambulatory Visit

## 2015-09-04 ENCOUNTER — Ambulatory Visit (HOSPITAL_COMMUNITY): Payer: Self-pay

## 2015-09-07 ENCOUNTER — Ambulatory Visit (HOSPITAL_COMMUNITY): Admission: RE | Admit: 2015-09-07 | Payer: Self-pay | Source: Ambulatory Visit

## 2015-09-07 ENCOUNTER — Ambulatory Visit (HOSPITAL_COMMUNITY): Payer: Self-pay

## 2015-09-11 ENCOUNTER — Telehealth: Payer: Self-pay

## 2015-09-11 ENCOUNTER — Ambulatory Visit (HOSPITAL_COMMUNITY): Admission: RE | Admit: 2015-09-11 | Payer: Self-pay | Source: Ambulatory Visit

## 2015-09-11 ENCOUNTER — Institutional Professional Consult (permissible substitution): Payer: Self-pay | Admitting: Neurology

## 2015-09-11 ENCOUNTER — Ambulatory Visit (HOSPITAL_COMMUNITY): Payer: Self-pay | Attending: Diagnostic Neuroimaging

## 2015-09-11 NOTE — Telephone Encounter (Signed)
Patient did not show to new sleep consult appt.  

## 2015-09-14 ENCOUNTER — Encounter: Payer: Self-pay | Admitting: Neurology

## 2015-09-27 ENCOUNTER — Ambulatory Visit: Payer: Self-pay | Admitting: Diagnostic Neuroimaging

## 2015-09-27 ENCOUNTER — Telehealth: Payer: Self-pay | Admitting: *Deleted

## 2015-09-27 NOTE — Telephone Encounter (Signed)
LVM requesting pt to call back and reschedule due to provider being ill.

## 2015-10-17 ENCOUNTER — Emergency Department (HOSPITAL_BASED_OUTPATIENT_CLINIC_OR_DEPARTMENT_OTHER): Admission: EM | Admit: 2015-10-17 | Discharge: 2015-10-17 | Discharge status: Against Medical Advice | Payer: Self-pay

## 2015-10-17 NOTE — ED Notes (Signed)
Pt states she changed her mind and does not want to be seen. Pt encouraged to stay in ed and be seen by md, pt refuses, states 'I forgot I have an appointment I have to go to right now..." pt refuses to await triage and have vs taken. Pt leaves ed with her daughter who states she will make sure pt comes back if her pain worsens or continues.

## 2015-10-31 ENCOUNTER — Institutional Professional Consult (permissible substitution): Payer: Self-pay | Admitting: Neurology

## 2015-10-31 ENCOUNTER — Telehealth: Payer: Self-pay

## 2015-10-31 ENCOUNTER — Encounter: Payer: Self-pay | Admitting: Neurology

## 2015-10-31 NOTE — Telephone Encounter (Signed)
Pt has had three sleep consult no-shows.  08/22/2015 no show with Dr. Vickey Hugerohmeier  09/11/2015 no show with Dr. Frances FurbishAthar  10/31/2015 no show with Dr. Vickey Hugerohmeier  Per [facility] policy, this is grounds for dismissal. OK to dismiss?

## 2015-10-31 NOTE — Telephone Encounter (Signed)
Please discharge. CD

## 2015-11-02 ENCOUNTER — Encounter (HOSPITAL_BASED_OUTPATIENT_CLINIC_OR_DEPARTMENT_OTHER): Payer: Self-pay | Admitting: *Deleted

## 2015-11-02 ENCOUNTER — Encounter: Payer: Self-pay | Admitting: Neurology

## 2015-11-02 ENCOUNTER — Emergency Department (HOSPITAL_BASED_OUTPATIENT_CLINIC_OR_DEPARTMENT_OTHER)
Admission: EM | Admit: 2015-11-02 | Discharge: 2015-11-02 | Disposition: A | Discharge status: Home / Self Care | Payer: Self-pay | Attending: Emergency Medicine | Admitting: Emergency Medicine

## 2015-11-02 ENCOUNTER — Emergency Department (HOSPITAL_BASED_OUTPATIENT_CLINIC_OR_DEPARTMENT_OTHER): Payer: Self-pay

## 2015-11-02 DIAGNOSIS — F17200 Nicotine dependence, unspecified, uncomplicated: Secondary | ICD-10-CM | POA: Insufficient documentation

## 2015-11-02 DIAGNOSIS — S46912A Strain of unspecified muscle, fascia and tendon at shoulder and upper arm level, left arm, initial encounter: Secondary | ICD-10-CM | POA: Insufficient documentation

## 2015-11-02 DIAGNOSIS — Y9389 Activity, other specified: Secondary | ICD-10-CM | POA: Insufficient documentation

## 2015-11-02 DIAGNOSIS — Y929 Unspecified place or not applicable: Secondary | ICD-10-CM | POA: Insufficient documentation

## 2015-11-02 DIAGNOSIS — X509XXA Other and unspecified overexertion or strenuous movements or postures, initial encounter: Secondary | ICD-10-CM | POA: Insufficient documentation

## 2015-11-02 DIAGNOSIS — Y990 Civilian activity done for income or pay: Secondary | ICD-10-CM | POA: Insufficient documentation

## 2015-11-02 NOTE — ED Notes (Signed)
Pt c/o left shoulder injury x 2 days ago

## 2015-11-02 NOTE — Discharge Instructions (Signed)

## 2015-11-02 NOTE — ED Provider Notes (Signed)
CSN: [id number]     Arrival date & time 11/02/15  1958 History   First MD Initiated Contact with Patient 11/02/15 2126     Chief Complaint  Patient presents with  . Shoulder Injury     Patient is a 45 y.o. female presenting with shoulder injury. The history is provided by the patient. No language interpreter was used.  Shoulder Injury   Regina Thomas is a 45 y.o. female who presents to the Emergency Department complaining of shoulder injury.  She is a right-handed Nutritional therapistplumber and yesterday while at work she was pushing a water heater up the stairs when she developed a pain in her left anterior shoulder. The pain is a constant burning sensation that is worse with movement. The pain is present from the left anterior shoulder to the left mid upper arm. She also has pain to the left posterior shoulder with range of motion. No fevers, numbness, chest pain, abdominal pain, neck pain. History of rotator cuff tear 16 years ago.  History reviewed. No pertinent past medical history. Past Surgical History  Procedure Laterality Date  . Tonsillectomy  1976  . Appendectomy  1973  . Kidney surgery  1973  . Cesarean section  1993, 1997  . Inner ear surgery      tubes    Family History  Problem Relation Age of Onset  . Hypertension Mother   . Diabetes Mother   . Hypertension Father   . Diabetes Father   . Cancer Father     melanoma  . Cancer Maternal Grandmother     lung  . Heart attack Maternal Grandfather   . Stroke Maternal Grandfather    Social History  Substance Use Topics  . Smoking status: Current Every Day Smoker -- 0.50 packs/day for 15 years  . Smokeless tobacco: None  . Alcohol Use: Yes     Comment: socially   OB History    No data available     Review of Systems  All other systems reviewed and are negative.     Allergies  Codeine; Hydrocodone; Percocet; and Zithromax  Home Medications   Prior to Admission medications   Medication Sig Start Date End Date Taking?  Authorizing Provider  aspirin 81 MG tablet Take 81 mg by mouth daily.    Historical Provider, MD  atorvastatin (LIPITOR) 20 MG tablet Take 1 tablet (20 mg total) by mouth daily. 08/16/15   Suanne MarkerVikram R Penumalli, MD  esomeprazole (NEXIUM) 10 MG packet Take 10 mg by mouth daily before breakfast.    Historical Provider, MD   BP 131/86 mmHg  Pulse 84  Temp(Src) 99.5 F (37.5 C) (Oral)  Resp 16  Ht 5\' 7"  (1.702 m)  Wt 152 lb (68.947 kg)  BMI 23.80 kg/m2  SpO2 100%  LMP 10/12/2015 Physical Exam  Constitutional: She is oriented to person, place, and time. She appears well-developed and well-nourished.  HENT:  Head: Normocephalic and atraumatic.  Cardiovascular: Normal rate and regular rhythm.   No murmur heard. Pulmonary/Chest: Effort normal and breath sounds normal. No respiratory distress.  Musculoskeletal:  Left shoulder with diffuse tenderness to palpation. No deformities or masses. 2+ radial pulses bilaterally. Able to abduct to 90 and cross midline with the left upper extremity.  Neurological: She is alert and oriented to person, place, and time.  5 out of 5 grip strength bilaterally. Sensation to light touch intact bilateral upper extremities.  Skin: Skin is warm and dry.  Psychiatric: She has a normal mood and affect.  Her behavior is normal.  Nursing note and vitals reviewed.   ED Course  Procedures (including critical care time) Labs Review Labs Reviewed - No data to display  Imaging Review Dg Shoulder Left  11/02/2015  CLINICAL DATA:  Pulling injury this shoulder 2 days ago moving a water scooter. EXAM: LEFT SHOULDER - 2+ VIEW COMPARISON:  None. FINDINGS: There is no evidence of fracture or dislocation. There is no evidence of arthropathy or other focal bone abnormality. Soft tissues are unremarkable. IMPRESSION: Negative. Electronically Signed   By: Gaylyn Rong M.D.   On: 11/02/2015 21:01   I have personally reviewed and evaluated these images and lab results as part of  my medical decision-making.   EKG Interpretation None      MDM   Final diagnoses:  Shoulder strain, left, initial encounter    Patient here for evaluation and shoulder pain. She is neurovascularly intact on examination with range of motion intact. Discussed home care for shoulder pain and strain. Discussed outpatient follow-up, home care, return precautions. No evidence of acute fracture/dislocation, septic arthritis.  Tilden Fossa, MD 11/02/15 [phone]

## 2016-10-20 ENCOUNTER — Emergency Department (HOSPITAL_BASED_OUTPATIENT_CLINIC_OR_DEPARTMENT_OTHER): Payer: Self-pay

## 2016-10-20 ENCOUNTER — Encounter (HOSPITAL_BASED_OUTPATIENT_CLINIC_OR_DEPARTMENT_OTHER): Payer: Self-pay | Admitting: Emergency Medicine

## 2016-10-20 ENCOUNTER — Emergency Department (HOSPITAL_BASED_OUTPATIENT_CLINIC_OR_DEPARTMENT_OTHER)
Admission: EM | Admit: 2016-10-20 | Discharge: 2016-10-20 | Disposition: A | Discharge status: Home / Self Care | Payer: Self-pay | Attending: Emergency Medicine | Admitting: Emergency Medicine

## 2016-10-20 DIAGNOSIS — N390 Urinary tract infection, site not specified: Secondary | ICD-10-CM | POA: Insufficient documentation

## 2016-10-20 DIAGNOSIS — M25561 Pain in right knee: Secondary | ICD-10-CM | POA: Insufficient documentation

## 2016-10-20 DIAGNOSIS — M545 Low back pain: Secondary | ICD-10-CM | POA: Insufficient documentation

## 2016-10-20 DIAGNOSIS — M62830 Muscle spasm of back: Secondary | ICD-10-CM | POA: Insufficient documentation

## 2016-10-20 DIAGNOSIS — R300 Dysuria: Secondary | ICD-10-CM

## 2016-10-20 DIAGNOSIS — F17200 Nicotine dependence, unspecified, uncomplicated: Secondary | ICD-10-CM | POA: Insufficient documentation

## 2016-10-20 DIAGNOSIS — Z79899 Other long term (current) drug therapy: Secondary | ICD-10-CM | POA: Insufficient documentation

## 2016-10-20 DIAGNOSIS — G8929 Other chronic pain: Secondary | ICD-10-CM | POA: Insufficient documentation

## 2016-10-20 HISTORY — DX: Diaphragmatic hernia without obstruction or gangrene: K44.9

## 2016-10-20 LAB — URINALYSIS, ROUTINE W REFLEX MICROSCOPIC
BILIRUBIN URINE: NEGATIVE
GLUCOSE, UA: NEGATIVE mg/dL
Hgb urine dipstick: NEGATIVE
Ketones, ur: NEGATIVE mg/dL
NITRITE: POSITIVE — AB
PH: 6.5 (ref 5.0–8.0)
Protein, ur: NEGATIVE mg/dL
SPECIFIC GRAVITY, URINE: 1.011 (ref 1.005–1.030)

## 2016-10-20 LAB — URINALYSIS, MICROSCOPIC (REFLEX): RBC / HPF: NONE SEEN RBC/hpf (ref 0–5)

## 2016-10-20 MED ORDER — SULFAMETHOXAZOLE-TRIMETHOPRIM 800-160 MG PO TABS: 1.0000 | ORAL_TABLET | Freq: Two times a day (BID) | ORAL | 0 refills | Status: DC

## 2016-10-20 MED ORDER — NAPROXEN 500 MG PO TABS: 500.0000 mg | ORAL_TABLET | Freq: Two times a day (BID) | ORAL | 0 refills | Status: DC | PRN

## 2016-10-20 MED ORDER — CYCLOBENZAPRINE HCL 10 MG PO TABS: 10.0000 mg | ORAL_TABLET | Freq: Three times a day (TID) | ORAL | 0 refills | Status: DC | PRN

## 2016-10-20 MED ORDER — KETOROLAC TROMETHAMINE 30 MG/ML IJ SOLN
30.0000 mg | Freq: Once | INTRAMUSCULAR | Status: AC
  Administered 2016-10-20: 30 mg via INTRAMUSCULAR
  Filled 2016-10-20: qty 1

## 2016-10-20 NOTE — ED Triage Notes (Addendum)
Dysuria x 2 days with back pain. Also reports R knee pain x 2 weeks. Denies injury.

## 2016-10-20 NOTE — Discharge Instructions (Signed)
For your urinary tract infection: Stay very well hydrated with plenty of water throughout the day. Take antibiotic until completed. Alternate between tylenol and naprosyn as needed for pain. Follow up with the [facility] in 1 week for recheck of ongoing symptoms and to establish medical care, but return to ER for emergent changing or worsening of symptoms. Please seek immediate care if you develop the following: You develop back pain.  Your symptoms are no better, or worse in 3 days. There is severe back pain or lower abdominal pain.  You develop chills.  You have a fever.  There is nausea or vomiting.  There is continued burning or discomfort with urination.   For your knee pain: use the knee sleeve as needed for comfort, ice and elevate your knee to help with pain and swelling, get shoe inserts or new shoes to help provide support to your knees/body, alternate between tylenol and naprosyn as needed for pain, and follow up with the sports medicine clinic in 1 week for recheck of symptoms and ongoing management of your knee and back pain.   For your back pain: see full set of instructions below. Take naprosyn as directed for inflammation and pain with tylenol for breakthrough pain and flexeril for muscle relaxation. Do not drive or operate machinery with muscle relaxant use. Use heat to areas of soreness, no more than 20 minutes at a time every hour. Follow up with the sports medicine clinic in 1 week for recheck of ongoing symptoms.   Back Pain: Your back pain should be treated with medicines such as ibuprofen or aleve and this back pain should get better over the next 2 weeks.  However if you develop severe or worsening pain, low back pain with fever, numbness, weakness or inability to walk or urinate, you should return to the ER immediately.  Please follow up with your doctor this week for a recheck if still having symptoms.  Avoid heavy lifting over 10 pounds over the next two  weeks.  Low back pain is discomfort in the lower back that may be due to injuries to muscles and ligaments around the spine.  Occasionally, it may be caused by a a problem to a part of the spine called a disc.  The pain may last several days or a week;  However, most patients get completely well in 4 weeks.  Self - care:  The application of heat can help soothe the pain.  Maintaining your daily activities, including walking, is encourged, as it will help you get better faster than just staying in bed. Perform gentle stretching as discussed. Drink plenty of fluids.  Medications are also useful to help with pain control.  A commonly prescribed medication includes over the counter tylenol. Take as directed, as needed for pain relief.   Non steroidal anti inflammatory medications including Ibuprofen and naproxen;  These medications help both pain and swelling and are very useful in treating back pain.  They should be taken with food, as they can cause stomach upset, and more seriously, stomach bleeding.    Muscle relaxants:  These medications can help with muscle tightness that is a cause of lower back pain.  Most of these medications can cause drowsiness, and it is not safe to drive or use dangerous machinery while taking them.  SEEK IMMEDIATE MEDICAL ATTENTION IF: New numbness, tingling, weakness, or problem with the use of your arms or legs.  Severe back pain not relieved with medications.  Difficulty with or loss of control of your bowel or bladder control.  Increasing pain in any areas of the body (such as chest or abdominal pain).  Shortness of breath, dizziness or fainting.  Nausea (feeling sick to your stomach), vomiting, fever, or sweats.

## 2016-10-20 NOTE — ED Provider Notes (Signed)
[facility]   CSN: [id number] Arrival date & time: 10/20/16  1256     History   Chief Complaint Chief Complaint  Patient presents with  . Dysuria  . Knee Pain    HPI Regina Thomas is a 46 y.o. 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 The most recent complaint is dysuria 2 days that she describes as a mild burning sensation with every urination, with associated malodorous urine and increased urinary frequency and urgency. Secondarily she complains of intermittent back pain and right knee pain 1 month. Patient states that she works at [employer] tree and does a lot of heavy lifting and stands for long periods of time, thinks that she may have pulled something in her back. She describes her back pain as 8/10 intermittent aching nonradiating lower back pain worse with standing and improved with aspirin. Regarding her right knee, she states that she's had history of issues with this knee before, but denies any recent injury. Additional associated symptoms includes right knee swelling.   She denies fevers, chills, CP, SOB, abd pain, nausea/vomiting, diarrhea/constipation, obstipation, melena, hematochezia, hematuria, vaginal bleeding/discharge, incontinence of urine/stool, saddle anesthesia/cauda equina symptoms, myalgias, numbness, tingling, focal weakness, or any other complaints at this time. Denies recent travel, sick contacts, suspicious food intake, EtOH use, or frequent NSAID use. LMP 3 wks ago. Denies being sexually active. Denies hx of CA or IVDU.    The history is provided by the patient and medical records. No language interpreter was used.  Dysuria   This is a new problem. The current episode started 2 days ago. The problem occurs  every urination. The problem has not changed since onset.The quality of the pain is described as burning. The pain is mild. There has been no fever. She is not sexually active. Associated symptoms include frequency and urgency. Pertinent negatives include no chills, no nausea, no vomiting, no discharge and no hematuria. She has tried aspirin for the symptoms.  Knee Pain   Pertinent negatives include no numbness.    Past Medical History:  Diagnosis Date  . Hiatal hernia     There are no active problems to display for this patient.   Past Surgical History:  Procedure Laterality Date  . APPENDECTOMY  1973  . CESAREAN SECTION  1993, 1997  . INNER EAR SURGERY     tubes   . KIDNEY SURGERY  1973  . TONSILLECTOMY  1976    OB History    No data available       Home Medications    Prior to Admission medications   Medication Sig Start Date End Date Taking? Authorizing Provider  aspirin 81 MG tablet Take 81 mg by mouth daily.   Yes Historical Provider, MD  atorvastatin (LIPITOR) 20 MG tablet Take 1 tablet (20 mg total) by mouth daily. 08/16/15   Suanne Marker, MD  esomeprazole (NEXIUM) 10 MG packet Take 10 mg by mouth daily before breakfast.    Historical Provider, MD    Family History Family History  Problem Relation Age of Onset  . Hypertension Mother   . Diabetes Mother   . Hypertension Father   . Diabetes Father   . Cancer Father     melanoma  . Cancer Maternal  Grandmother     lung  . Heart attack Maternal Grandfather   . Stroke Maternal Grandfather     Social History Social History  Substance Use Topics  . Smoking status: Current Every Day Smoker    Packs/day: 0.50    Years: 15.00  . Smokeless tobacco: Never Used  . Alcohol use Yes     Comment: socially     Allergies   Codeine; Hydrocodone; Percocet [oxycodone-acetaminophen]; and Zithromax [azithromycin]   Review of Systems Review of Systems  Constitutional: Negative for chills and fever.    Respiratory: Negative for shortness of breath.   Cardiovascular: Negative for chest pain.  Gastrointestinal: Negative for abdominal pain, blood in stool, constipation, diarrhea, nausea and vomiting.  Genitourinary: Positive for dysuria, frequency and urgency. Negative for difficulty urinating (no incontinence), hematuria, vaginal bleeding and vaginal discharge.       +malodorous urine  Musculoskeletal: Positive for arthralgias (R knee), back pain and joint swelling (R knee). Negative for myalgias.  Skin: Negative for color change.  Allergic/Immunologic: Negative for immunocompromised state.  Neurological: Negative for weakness and numbness.  Psychiatric/Behavioral: Negative for confusion.   10 Systems reviewed and are negative for acute change except as noted in the HPI.   Physical Exam Updated Vital Signs BP 137/78 (BP Location: Left Arm)   Pulse 81   Temp 98.5 F (36.9 C) (Oral)   Resp 17   Ht  (1.702 m)   Wt 68 kg   LMP 09/24/2016   SpO2 100%   BMI 23.49 kg/m   Physical Exam  Constitutional: She is oriented to person, place, and time. Vital signs are normal. She appears well-developed and well-nourished.  Non-toxic appearance. No distress.  Afebrile, nontoxic, NAD  HENT:  Head: Normocephalic and atraumatic.  Mouth/Throat: Oropharynx is clear and moist and mucous membranes are normal.  Eyes: Conjunctivae and EOM are normal. Right eye exhibits no discharge. Left eye exhibits no discharge.  Neck: Normal range of motion. Neck supple.  Cardiovascular: Normal rate, regular rhythm, normal heart sounds and intact distal pulses.  Exam reveals no gallop and no friction rub.   No murmur heard. Pulmonary/Chest: Effort normal and breath sounds normal. No respiratory distress. She has no decreased breath sounds. She has no wheezes. She has no rhonchi. She has no rales.  Abdominal: Soft. Normal appearance and bowel sounds are normal. She exhibits no distension. There is tenderness in  the suprapubic area. There is no rigidity, no rebound, no guarding, no CVA tenderness, no tenderness at McBurney's point and negative Murphy's sign.  Soft, nondistended, +BS throughout, with very mild suprapubic discomfort but no significant abdominal TTP, no r/g/r, neg murphy's, neg mcburney's, no CVA TTP   Musculoskeletal: Normal range of motion.       Right knee: She exhibits effusion. She exhibits normal range of motion, no swelling, no ecchymosis, no deformity, no laceration, no erythema, normal alignment, no LCL laxity, normal patellar mobility and no MCL laxity. Tenderness found. Medial joint line and lateral joint line tenderness noted.       Lumbar back: She exhibits tenderness and spasm. She exhibits normal range of motion, no bony tenderness, no swelling and no deformity.       Back:  Lumbar spine with FROM intact without spinous process TTP, no bony stepoffs or deformities, with mild R sided paraspinous muscle TTP and muscle spasms. Gait steady and nonantalgic. No overlying skin changes. R knee with FROM intact, with mild diffuse joint line TTP, very trace effusion noted,  no significant swelling, no crepitus/deformity, no bruising or erythema, no warmth, no abnormal alignment or patellar mobility, no varus/valgus laxity, neg anterior drawer test.  Strength and sensation grossly intact, distal pulses intact, compartments soft in all extremities  Neurological: She is alert and oriented to person, place, and time. She has normal strength. No sensory deficit.  Skin: Skin is warm, dry and intact. No rash noted.  Psychiatric: She has a normal mood and affect.  Nursing note and vitals reviewed.    ED Treatments / Results  Labs (all labs ordered are listed, but only abnormal results are displayed) Labs Reviewed  URINALYSIS, ROUTINE W REFLEX MICROSCOPIC - Abnormal; Notable for the following:       Result Value   APPearance CLOUDY (*)    Nitrite POSITIVE (*)    Leukocytes, UA MODERATE (*)      All other components within normal limits  URINALYSIS, MICROSCOPIC (REFLEX) - Abnormal; Notable for the following:    Bacteria, UA MANY (*)    Squamous Epithelial / LPF 0-5 (*)    All other components within normal limits    EKG  EKG Interpretation None       Radiology Dg Knee Complete 4 Views Right  Result Date: 10/20/2016 CLINICAL DATA:  Right knee pain for 2 weeks. History of patellar dislocations. EXAM: RIGHT KNEE - COMPLETE 4+ VIEW COMPARISON:  06/23/2011 and 10/12/2006 radiographs FINDINGS: No evidence of fracture, dislocation, or joint effusion. No evidence of arthropathy or other focal bone abnormality. Soft tissues are unremarkable. IMPRESSION: Negative. Electronically Signed   By: Harmon Pier M.D.   On: 10/20/2016 14:38    Procedures Procedures (including critical care time)  Medications Ordered in ED Medications  ketorolac (TORADOL) 30 MG/ML injection 30 mg (30 mg Intramuscular Given 10/20/16 1339)     Initial Impression / Assessment and Plan / ED Course  I have reviewed the triage vital signs and the nursing notes.  Pertinent labs & imaging results that were available during my care of the patient were reviewed by me and considered in my medical decision making (see chart for details).     46 y.o. female here with multiple complaints; primary complaint is dysuria x2 days and malodorous urine/urine freq-urg. Secondarily states back pain and R knee pain x1 month. Stands a lot at work, does heavy lifting at work. Hx of R knee quad tendon tear (tx'd nonsurgically) and arthritis. On exam, mild suprapubic discomfort, no flank tenderness, mild R lumbar paraspinous muscle TTP and spasm; no midline spinal tenderness. Mild diffuse R knee tenderness, very slight effusion but no significant swelling, no erythema/warmth. No red flag s/s of low back pain, no s/sx of septic joint. No s/s of central cord compression or cauda equina. Lower extremities are neurovascularly intact and  patient is ambulating without difficulty. Back pain is likely muscular, doubt need for imaging; xray R knee ordered, will let this be done since this can help evaluate how significant her arthritis is; will get U/A. Doubt need for other emergent work up at this time, doubt pyelonephritis. Will give toradol and reassess shortly.  3:43 PM Pt feeling better. Knee Xray negative for acute injury/finding, no significant arthropathy present. Advised use of knee sleeve for comfort (pt has this already), RICE, shoe inserts for proper support and improved posture, PRN naprosyn/tylenol, and f/up with sports medicine for ongoing evaluation and management of her knee pain.   U/A with +nitrites, +leuks, 0-5 squamous, 6-30 WBC, and many bacteria, consistent with UTI. Will  send home with abx for this. Advised OTC remedies for symptom relief, and adequate hydration. F/up with [facility] in 1wk for recheck and to establish care.   For her back pain, patient was counseled on back pain precautions and told to do activity as tolerated but do not lift, push, or pull heavy objects more than 10 pounds for the next week. Patient counseled to use ice or heat on back for no longer than 15 minutes every hour. Rx given for muscle relaxer and counseled on proper use of muscle relaxant medication. Urged patient not to drink alcohol, drive, or perform any other activities that requires focus while taking these medications. Rx for naprosyn given. Advised tylenol use as well.   Patient urged to follow-up with [facility] in 1-2wks for recheck of symptoms and to establish care, or sooner if pain does not improve with treatment and rest or if pain becomes recurrent. Urged to return with worsening severe pain, loss of bowel or bladder control, trouble walking, etc. Also advised f/up with sports medicine as mentioned previously. I explained the diagnosis and have given explicit precautions to return to the ER including for any other new or worsening  symptoms. The patient understands and accepts the medical plan as it's been dictated and I have answered their questions. Discharge instructions concerning home care and prescriptions have been given. The patient is STABLE and is discharged to home in good condition.     Final Clinical Impressions(s) / ED Diagnoses   Final diagnoses:  Dysuria  Lower urinary tract infectious disease  Chronic pain of right knee  Chronic right-sided low back pain without sciatica  Back muscle spasm    New Prescriptions New Prescriptions   CYCLOBENZAPRINE (FLEXERIL) 10 MG TABLET    Take 1 tablet (10 mg total) by mouth 3 (three) times daily as needed for muscle spasms.   NAPROXEN (NAPROSYN) 500 MG TABLET    Take 1 tablet (500 mg total) by mouth 2 (two) times daily as needed for mild pain, moderate pain or headache (TAKE WITH MEALS.).   SULFAMETHOXAZOLE-TRIMETHOPRIM (BACTRIM DS,SEPTRA DS) 800-160 MG TABLET    Take 1 tablet by mouth 2 (two) times daily.     [address] PA-C 10/20/16 1546    Melene Plan, DO 10/21/16 [phone]

## 2017-02-12 IMAGING — CT CT ANGIO HEAD
3 of 9 series · 14 of 47 positions shown · IV contrast (APPLIED)
Comparison: None.

CLINICAL DATA: 45-year-old female with sudden onset headache, left
upper extremity numbness. Code stroke. Initial encounter.

EXAM:
CT ANGIOGRAPHY HEAD
TECHNIQUE: Multidetector CT imaging of the head was performed using the
standard protocol during bolus administration of intravenous
contrast. Multiplanar CT image reconstructions and MIPs were
obtained to evaluate the vascular anatomy.
CONTRAST:  100mL OMNIPAQUE IOHEXOL 350 MG/ML SOLN

[Series 8: ax thin · axial · 0.49mm/px · z∈[-149,-29]mm · 8 of 144 slices shown]
[im 12/144  brain]
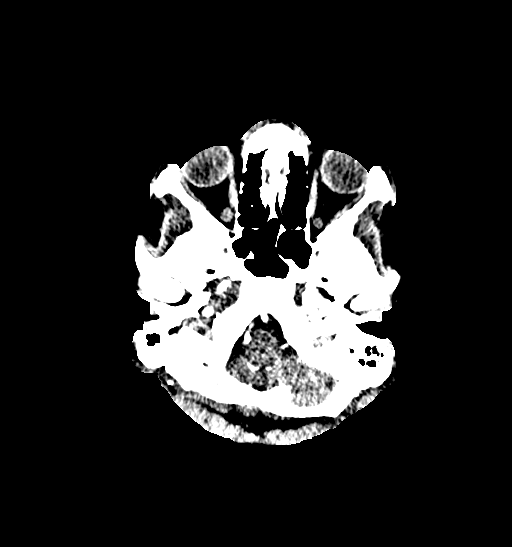
[im 36/144  bone]
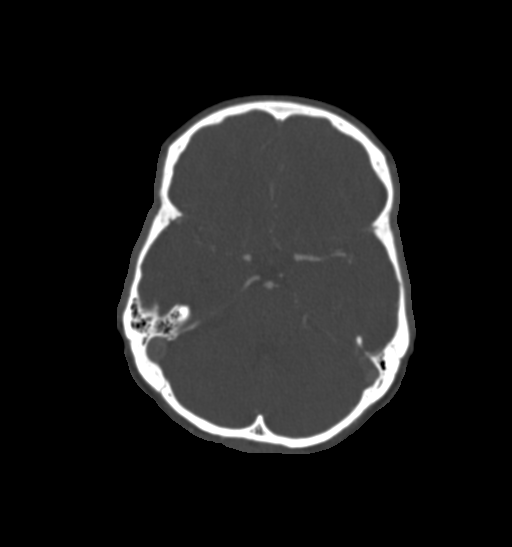
[im 48/144  brain]
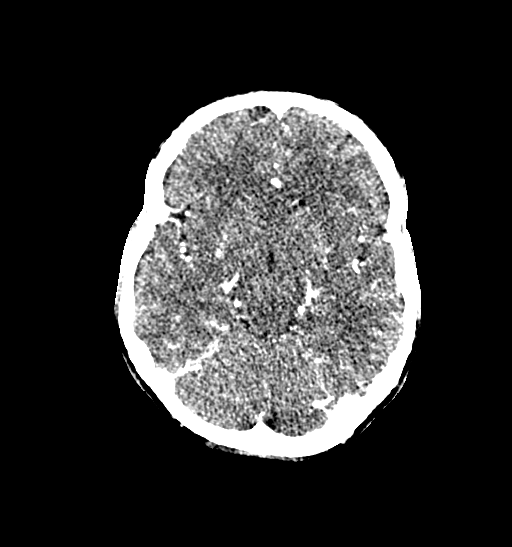
[im 60/144  bone]
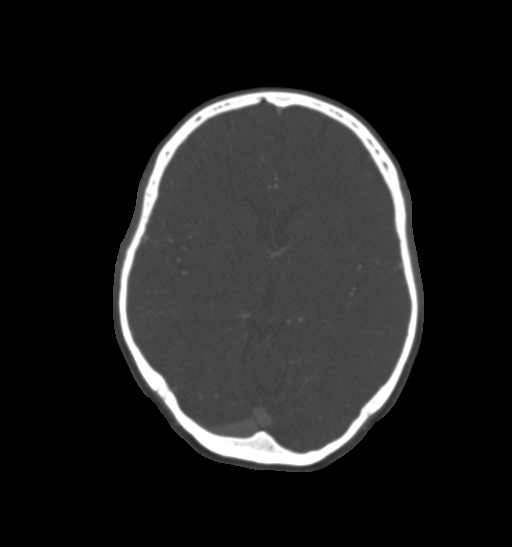
[im 84/144  brain]
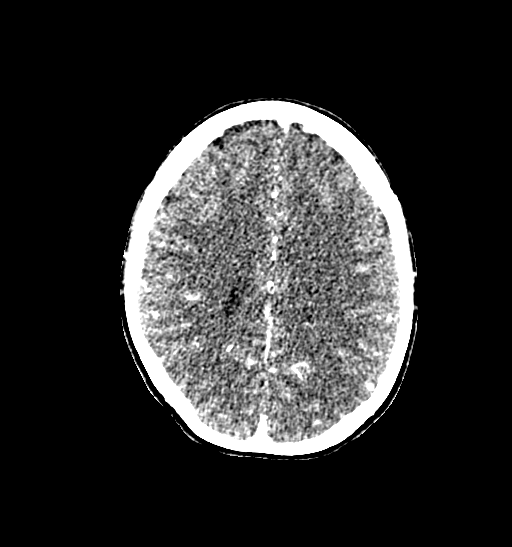
[im 96/144  bone]
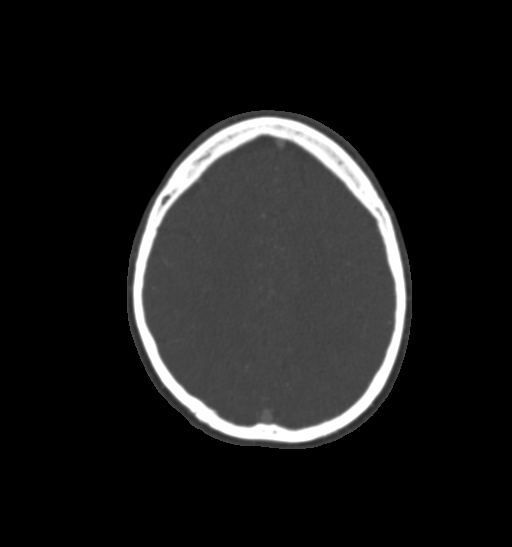
[im 108/144  brain]
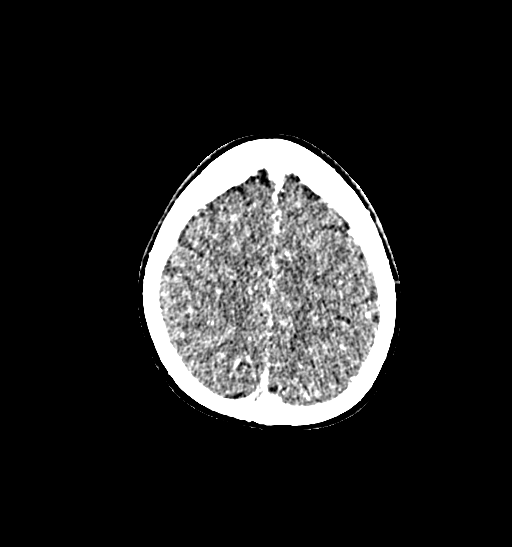
[im 132/144  bone]
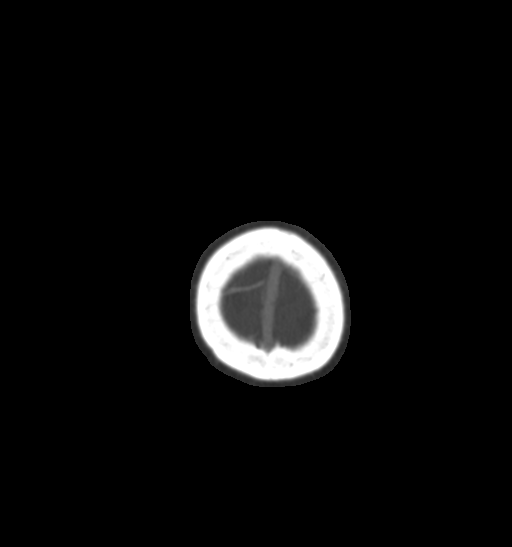

[Series 10: cor thin · coronal · 0.33mm/px · 3 of 185 slices shown]
[im 53/185  brain]
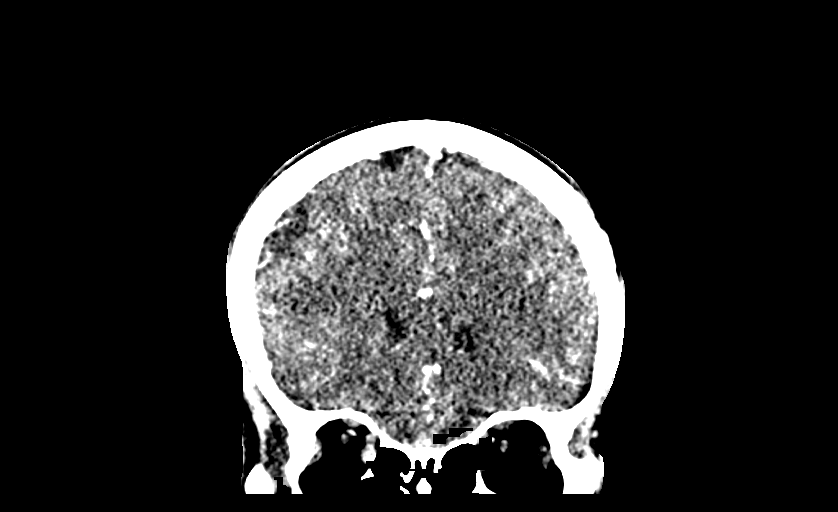
[im 79/185  brain]
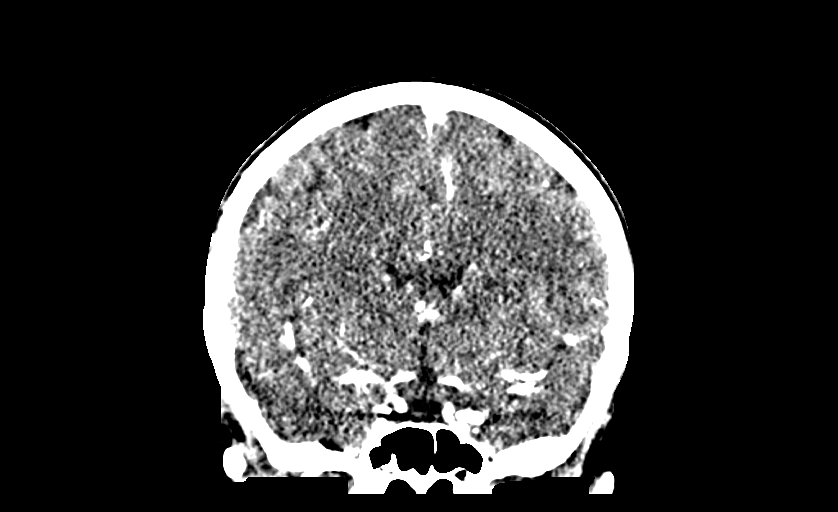
[im 106/185  brain]
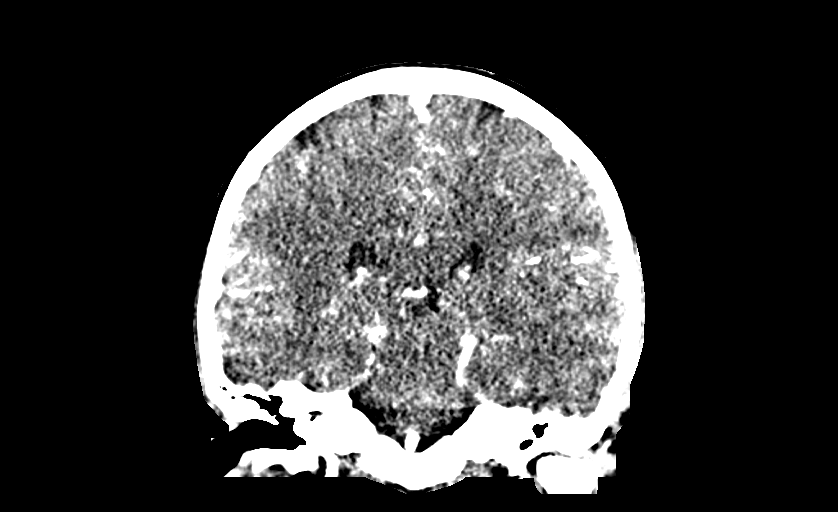

[Series 12: sag thin · sagittal · 0.31mm/px · 3 of 165 slices shown]
[im 33/165  brain]
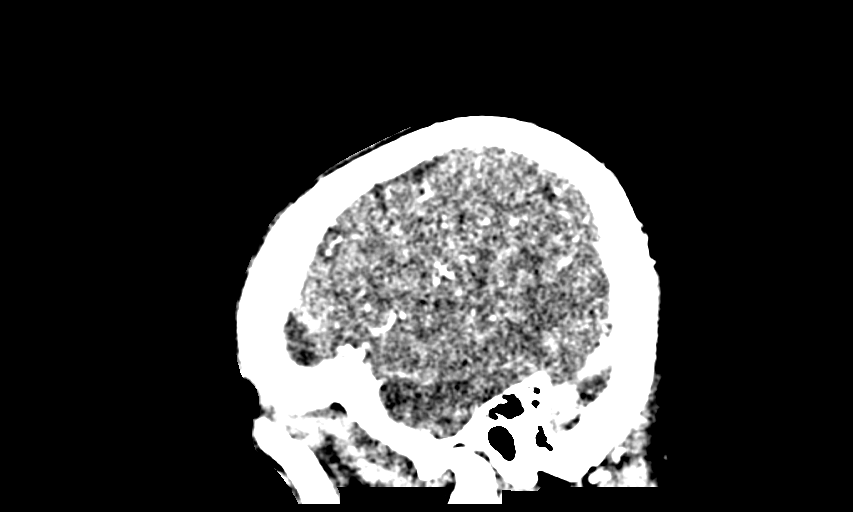
[im 66/165  brain]
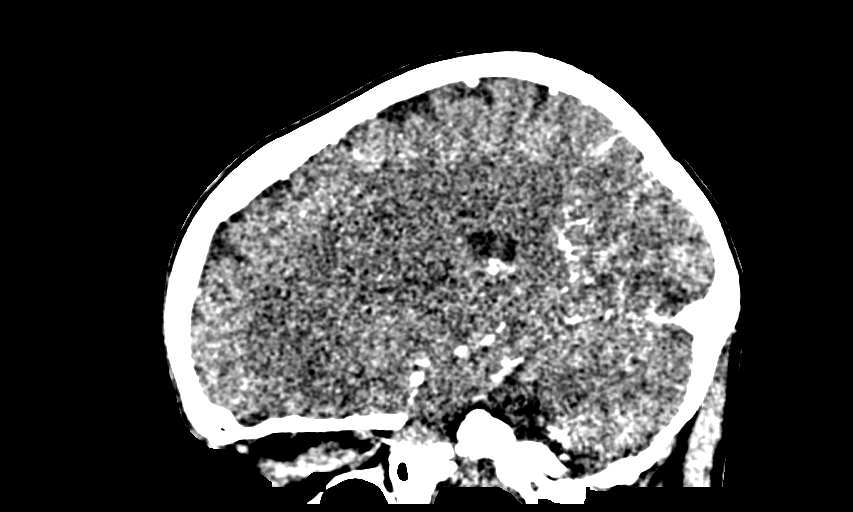
[im 99/165  brain]
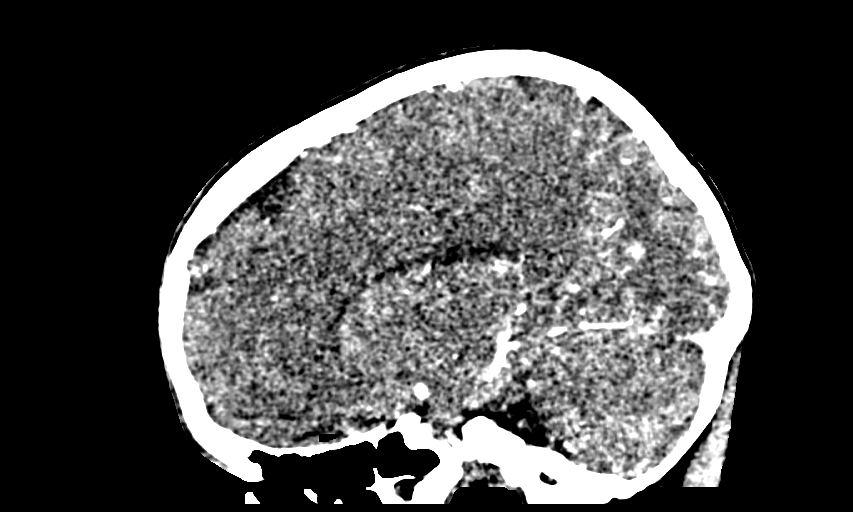

[14 of 47 positions shown; findings below may reference images not displayed]

FINDINGS: CT HEAD

Brain: Cerebral volume is normal. No midline shift,
ventriculomegaly, mass effect, evidence of mass lesion, intracranial
hemorrhage or evidence of cortically based acute infarction.
Gray-white matter differentiation is within normal limits throughout
the brain.

Calvarium and skull base: No osseous abnormality identified.

Paranasal sinuses: Visualized paranasal sinuses and mastoids are
clear.

Orbits: Visualized orbits and scalp soft tissues are within normal
limits.

CTA HEAD

Posterior circulation: Vertebral artery V4 segments appear normal.
Both PICA origins are patent. Patent vertebrobasilar junction. No
basilar artery stenosis. Normal SCA and PCA origins. Both posterior
communicating arteries are present, the right is diminutive. Normal
PCA branches.

Anterior circulation: Negative distal cervical ICAs. Normal left ICA
siphons; the left ophthalmic artery has a slightly unusual more
lateral origin (series 8, image 29). Normal left posterior
communicating artery origin. Normal right ICA siphon. The right
ophthalmic artery origin is also somewhat lateral.

Normal carotid termini. Normal MCA and ACA origins. Diminutive
anterior communicating artery. Normal bilateral ACA branches. Left
MCA origin, M1 segment, trifurcation and left MCA branches are
within normal limits. Right MCA origin, M1 segment, trifurcation and
right MCA branches are within normal limits.

Venous sinuses: Patent.

Anatomic variants: Lateral origins of the ophthalmic artery is, more
so the left.

Delayed phase:No abnormal enhancement identified.
IMPRESSION: Negative for intracranial emergent large vessel occlusion. Negative
intracranial CTA. Normal CT appearance of the brain.

## 2017-02-28 ENCOUNTER — Encounter (HOSPITAL_BASED_OUTPATIENT_CLINIC_OR_DEPARTMENT_OTHER): Payer: Self-pay | Admitting: *Deleted

## 2017-02-28 ENCOUNTER — Emergency Department (HOSPITAL_BASED_OUTPATIENT_CLINIC_OR_DEPARTMENT_OTHER)
Admission: EM | Admit: 2017-02-28 | Discharge: 2017-02-28 | Disposition: A | Discharge status: Home / Self Care | Payer: Self-pay | Attending: Emergency Medicine | Admitting: Emergency Medicine

## 2017-02-28 DIAGNOSIS — H9203 Otalgia, bilateral: Secondary | ICD-10-CM | POA: Insufficient documentation

## 2017-02-28 NOTE — ED Triage Notes (Signed)
Pain in both ears x 2 weeks. Started after getting back from the beach and swimming in her pool. Several other people have gotten sick after swimming in the same neighborhood pool per pt.

## 2017-03-01 ENCOUNTER — Emergency Department (HOSPITAL_BASED_OUTPATIENT_CLINIC_OR_DEPARTMENT_OTHER)
Admission: EM | Admit: 2017-03-01 | Discharge: 2017-03-01 | Disposition: A | Discharge status: Home / Self Care | Payer: Self-pay | Attending: Emergency Medicine | Admitting: Emergency Medicine

## 2017-03-01 ENCOUNTER — Encounter (HOSPITAL_BASED_OUTPATIENT_CLINIC_OR_DEPARTMENT_OTHER): Payer: Self-pay

## 2017-03-01 DIAGNOSIS — F17200 Nicotine dependence, unspecified, uncomplicated: Secondary | ICD-10-CM | POA: Insufficient documentation

## 2017-03-01 DIAGNOSIS — Z7982 Long term (current) use of aspirin: Secondary | ICD-10-CM | POA: Insufficient documentation

## 2017-03-01 DIAGNOSIS — H6690 Otitis media, unspecified, unspecified ear: Secondary | ICD-10-CM

## 2017-03-01 DIAGNOSIS — Z79899 Other long term (current) drug therapy: Secondary | ICD-10-CM | POA: Insufficient documentation

## 2017-03-01 DIAGNOSIS — H6693 Otitis media, unspecified, bilateral: Secondary | ICD-10-CM | POA: Insufficient documentation

## 2017-03-01 MED ORDER — AMOXICILLIN-POT CLAVULANATE 875-125 MG PO TABS: 1.0000 | ORAL_TABLET | Freq: Two times a day (BID) | ORAL | 0 refills | Status: AC

## 2017-03-01 MED ORDER — AMOXICILLIN-POT CLAVULANATE 875-125 MG PO TABS
1.0000 | ORAL_TABLET | Freq: Once | ORAL | Status: AC
  Administered 2017-03-01: 1 via ORAL
  Filled 2017-03-01: qty 1

## 2017-03-01 MED ORDER — IBUPROFEN 400 MG PO TABS
600.0000 mg | ORAL_TABLET | Freq: Once | ORAL | Status: AC
  Administered 2017-03-01: 600 mg via ORAL
  Filled 2017-03-01: qty 1

## 2017-03-01 MED ORDER — IBUPROFEN 600 MG PO TABS: 600.0000 mg | ORAL_TABLET | Freq: Four times a day (QID) | ORAL | 0 refills | Status: DC | PRN

## 2017-03-01 NOTE — ED Notes (Signed)
ED Provider at bedside. 

## 2017-03-01 NOTE — ED Triage Notes (Signed)
Pt reports bilateral ear pain after swimming and going to the beach x2 weeks.

## 2017-03-01 NOTE — ED Provider Notes (Signed)
[facility]   CSN: [id number] Arrival date & time: 03/01/17  1421     History   Chief Complaint Chief Complaint  Patient presents with  . Otalgia    HPI Regina Thomas is a 46 y.o. female.  HPI  46 y.o. female, presents to the Emergency Department today due to bilateral ear pain after swimming x 2 weeks. Notes feeling like her ears are underwater. Discharge noted on left ear with blood tinge. None today. No fevers. Rates pain 4/10. Throbbing. Mild sinus pressure. No rhinorrhea. No headaches. No vision changes. No meds PTA. Attempted Tylenol with minimal relief. No other symptoms noted.     Past Medical History:  Diagnosis Date  . Hiatal hernia     There are no active problems to display for this patient.   Past Surgical History:  Procedure Laterality Date  . APPENDECTOMY  1973  . CESAREAN SECTION  1993, 1997  . INNER EAR SURGERY     tubes   . KIDNEY SURGERY  1973  . TONSILLECTOMY  1976    OB History    No data available       Home Medications    Prior to Admission medications   Medication Sig Start Date End Date Taking? Authorizing Provider  aspirin 81 MG tablet Take 81 mg by mouth daily.   Yes [provider]  esomeprazole (NEXIUM) 10 MG packet Take 10 mg by mouth daily before breakfast.   Yes [provider]  atorvastatin (LIPITOR) 20 MG tablet Take 1 tablet (20 mg total) by mouth daily. 08/16/15   Penumalli, Glenford Bayley, MD  cyclobenzaprine (FLEXERIL) 10 MG tablet Take 1 tablet (10 mg total) by mouth 3 (three) times daily as needed for muscle spasms. 10/20/16   Street, [place], PA-C  naproxen (NAPROSYN) 500 MG tablet Take 1 tablet (500 mg total) by mouth 2 (two) times daily as needed for mild pain, moderate pain or headache (TAKE WITH MEALS.). 10/20/16   Street, [place], PA-C  sulfamethoxazole-trimethoprim (BACTRIM DS,SEPTRA DS) 800-160 MG tablet Take 1 tablet by mouth 2 (two) times daily. 10/20/16   Street, [place], PA-C     Family History Family History  Problem Relation Age of Onset  . Hypertension Mother   . Diabetes Mother   . Hypertension Father   . Diabetes Father   . Cancer Father        melanoma  . Cancer Maternal Grandmother        lung  . Heart attack Maternal Grandfather   . Stroke Maternal Grandfather     Social History Social History  Substance Use Topics  . Smoking status: Current Every Day Smoker    Packs/day: 0.50    Years: 15.00  . Smokeless tobacco: Never Used  . Alcohol use Yes     Comment: socially     Allergies   Codeine; Hydrocodone; Penicillins; Percocet [oxycodone-acetaminophen]; and Zithromax [azithromycin]   Review of Systems Review of Systems  HENT: Positive for ear pain. Negative for congestion, ear discharge, rhinorrhea, sinus pain and sinus pressure.   Gastrointestinal: Negative for nausea and vomiting.  Neurological: Negative for headaches.     Physical Exam Updated Vital Signs BP 132/69 (BP Location: Left Arm)   Pulse 86   Temp 98.4 F (36.9 C) (Oral)   Resp 18   LMP 02/14/2017   SpO2 100%   Physical Exam  Constitutional: She is oriented to person, place, and time. She appears well-developed and well-nourished. No distress.  HENT:  Head: Normocephalic and atraumatic.  Right Ear: Hearing, tympanic membrane, external ear and ear canal normal. There is drainage and tenderness. No mastoid tenderness. Tympanic membrane is not injected, not scarred, not perforated and not erythematous. No hemotympanum.  Left Ear: Hearing, tympanic membrane, external ear and ear canal normal. There is drainage and tenderness. No mastoid tenderness. Tympanic membrane is not injected, not scarred, not perforated and not erythematous. No hemotympanum.  Nose: Nose normal.  Mouth/Throat: Uvula is midline, oropharynx is clear and moist and mucous membranes are normal. No trismus in the jaw. No oropharyngeal exudate, posterior oropharyngeal erythema or tonsillar abscesses.    Eyes: Pupils are equal, round, and reactive to light. EOM are normal.  Neck: Normal range of motion. Neck supple. No tracheal deviation present.  Cardiovascular: Normal rate, regular rhythm, S1 normal, S2 normal, normal heart sounds, intact distal pulses and normal pulses.   Pulmonary/Chest: Effort normal and breath sounds normal. No respiratory distress. She has no decreased breath sounds. She has no wheezes. She has no rhonchi. She has no rales.  Abdominal: Normal appearance and bowel sounds are normal. There is no tenderness.  Musculoskeletal: Normal range of motion.  Neurological: She is alert and oriented to person, place, and time.  Skin: Skin is warm and dry.  Psychiatric: She has a normal mood and affect. Her speech is normal and behavior is normal. Thought content normal.     ED Treatments / Results  Labs (all labs ordered are listed, but only abnormal results are displayed) Labs Reviewed - No data to display  EKG  EKG Interpretation None       Radiology No results found.  Procedures Procedures (including critical care time)  Medications Ordered in ED Medications - No data to display   Initial Impression / Assessment and Plan / ED Course  I have reviewed the triage vital signs and the nursing notes.  Pertinent labs & imaging results that were available during my care of the patient were reviewed by me and considered in my medical decision making (see chart for details).  Final Clinical Impressions(s) / ED Diagnoses     {I have reviewed the relevant previous healthcare records.  {I obtained HPI from historian.   ED Course:  Assessment: Pt is a 46 y.o. female presents to the Emergency Department today due to bilateral ear pain after swimming x 2 weeks. Notes feeling like her ears are underwater. Discharge noted on left ear with blood tinge. None today. No fevers. Rates pain 4/10. Throbbing. Mild sinus pressure. No rhinorrhea. No headaches. No vision changes. No  meds PTA. Attempted Tylenol with minimal relief.. On exam, pt in NAD. Nontoxic/nonseptic appearing. VSS. Afebrile. Will treat for Otitis media given exam. No mastoid tenderness. TMs boggy. Plan is to DC home with ABX and follow up to PCP. At time of discharge, Patient is in no acute distress. Vital Signs are stable. Patient is able to ambulate. Patient able to tolerate PO.   Disposition/Plan:  DC Home Additional Verbal discharge instructions given and discussed with patient.  Pt Instructed to f/u with PCP in the next week for evaluation and treatment of symptoms. Return precautions given Pt acknowledges and agrees with plan  Supervising Physician Tegeler, Canary Brim, *  Final diagnoses:  Acute otitis media, unspecified otitis media type    New Prescriptions New Prescriptions   No medications on file     Audry Pili, PA-C 03/01/17 1659    Tegeler, Canary Brim, MD 03/02/17 [phone]
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
## 2017-03-17 ENCOUNTER — Emergency Department (HOSPITAL_BASED_OUTPATIENT_CLINIC_OR_DEPARTMENT_OTHER)
Admission: EM | Admit: 2017-03-17 | Discharge: 2017-03-17 | Disposition: A | Discharge status: Home / Self Care | Payer: Self-pay | Attending: Emergency Medicine | Admitting: Emergency Medicine

## 2017-03-17 ENCOUNTER — Encounter (HOSPITAL_BASED_OUTPATIENT_CLINIC_OR_DEPARTMENT_OTHER): Payer: Self-pay | Admitting: *Deleted

## 2017-03-17 DIAGNOSIS — F17200 Nicotine dependence, unspecified, uncomplicated: Secondary | ICD-10-CM | POA: Insufficient documentation

## 2017-03-17 DIAGNOSIS — Z79899 Other long term (current) drug therapy: Secondary | ICD-10-CM | POA: Insufficient documentation

## 2017-03-17 DIAGNOSIS — M25512 Pain in left shoulder: Secondary | ICD-10-CM

## 2017-03-17 DIAGNOSIS — Z7982 Long term (current) use of aspirin: Secondary | ICD-10-CM | POA: Insufficient documentation

## 2017-03-17 MED ORDER — CYCLOBENZAPRINE HCL 10 MG PO TABS: 10.0000 mg | ORAL_TABLET | Freq: Two times a day (BID) | ORAL | 0 refills | Status: DC | PRN

## 2017-03-17 MED FILL — CYCLOBENZAPRINE 10 MG TAB: 10 | 10 days supply | Qty: 20 | Fill #0

## 2017-03-17 NOTE — Discharge Instructions (Signed)
Medications: Flexeril  Treatment: Take Flexeril twice daily as prescribed. Do not drive or operate machinery while taking this medicine. Use ice 3-4 times daily alternating 15 min on, 15 min off. Attempt the range of motion exercises 1-2 times daily. Do not wear your sling 24/7- this could cause a frozen shoulder.  Follow-up: Please follow up with Dr. Pearletha ForgeHudnall, a sports medicine doctor, for further evaluation and treatment. Please return to the emergency department if you develop any new or worsening symptoms.

## 2017-03-17 NOTE — ED Triage Notes (Addendum)
Pt c/o left shoulder pain with movt x 3 days ago after lifting heavy boxes

## 2017-03-17 NOTE — ED Provider Notes (Signed)
[facility]   CSN: [id number] Arrival date & time: 03/17/17  1504     History   Chief Complaint Chief Complaint  Patient presents with  . Shoulder Pain    HPI Regina Thomas is a 46 y.o. female with history of left rotator cuff injury around 18 years ago who presents with left shoulder pain. Patient had been lifting heavy boxes 3 days ago and has had pain with movement of her shoulder since. She has had pain radiating to her wrist and hand with certain movements. She denies numbness or tingling. She has been taking ibuprofen without significant relief. She denies any chest pain or shortness of breath, fevers, or other complaints. She saw an orthopedic doctor 18 years ago in [facility] for her rotator cuff problem.  HPI  Past Medical History:  Diagnosis Date  . Hiatal hernia     There are no active problems to display for this patient.   Past Surgical History:  Procedure Laterality Date  . APPENDECTOMY  1973  . CESAREAN SECTION  1993, 1997  . INNER EAR SURGERY     tubes   . KIDNEY SURGERY  1973  . TONSILLECTOMY  1976    OB History    No data available       Home Medications    Prior to Admission medications   Medication Sig Start Date End Date Taking? Authorizing Provider  aspirin 81 MG tablet Take 81 mg by mouth daily.    [provider]  cyclobenzaprine (FLEXERIL) 10 MG tablet Take 1 tablet (10 mg total) by mouth 2 (two) times daily as needed for muscle spasms. 03/17/17   Tristy Udovich, Waylan [place], PA-C  esomeprazole (NEXIUM) 10 MG packet Take 10 mg by mouth daily before breakfast.    [provider]  ibuprofen (ADVIL,MOTRIN) 600 MG tablet Take 1 tablet (600 mg total) by mouth every 6 (six) hours as needed. 03/01/17   Audry Pili, PA-C  naproxen (NAPROSYN) 500 MG tablet Take 1 tablet (500 mg total) by mouth 2 (two) times daily as needed for mild pain, moderate pain or headache (TAKE WITH MEALS.). 10/20/16   Street, [place], PA-C    sulfamethoxazole-trimethoprim (BACTRIM DS,SEPTRA DS) 800-160 MG tablet Take 1 tablet by mouth 2 (two) times daily. 10/20/16   Street, [place], PA-C    Family History Family History  Problem Relation Age of Onset  . Hypertension Mother   . Diabetes Mother   . Hypertension Father   . Diabetes Father   . Cancer Father        melanoma  . Cancer Maternal Grandmother        lung  . Heart attack Maternal Grandfather   . Stroke Maternal Grandfather     Social History Social History  Substance Use Topics  . Smoking status: Current Every Day Smoker    Packs/day: 0.50    Years: 15.00  . Smokeless tobacco: Never Used  . Alcohol use Yes     Comment: socially     Allergies   Codeine; Hydrocodone; Penicillins; Percocet [oxycodone-acetaminophen]; and Zithromax [azithromycin]   Review of Systems Review of Systems  Constitutional: Negative for fever.  Respiratory: Negative for shortness of breath.   Cardiovascular: Negative for chest pain.  Musculoskeletal: Positive for arthralgias (L shoulder). Negative for neck pain.  Skin: Negative for rash and wound.  Neurological: Negative for numbness.     Physical Exam Updated Vital Signs BP 134/82 (BP Location: Right Arm)   Pulse 68  Temp 98.6 F (37 C) (Oral)   Resp 16   Ht 5\' 7"  (1.702 m)   Wt 68.9 kg (152 lb)   LMP 03/10/2017   SpO2 100%   BMI 23.81 kg/m   Physical Exam  Constitutional: She appears well-developed and well-nourished. No distress.  HENT:  Head: Normocephalic and atraumatic.  Mouth/Throat: Oropharynx is clear and moist. No oropharyngeal exudate.  Eyes: Pupils are equal, round, and reactive to light. Conjunctivae are normal. Right eye exhibits no discharge. Left eye exhibits no discharge. No scleral icterus.  Neck: Normal range of motion. Neck supple. No thyromegaly present.  No midline tenderness  Cardiovascular: Normal rate, regular rhythm, normal heart sounds and intact distal pulses.  Exam reveals no  gallop and no friction rub.   No murmur heard. Pulmonary/Chest: Effort normal and breath sounds normal. No stridor. No respiratory distress. She has no wheezes. She has no rales.  Musculoskeletal: She exhibits no edema.  Upper trapezius and left anterior shoulder tenderness; no significant bony tenderness; radial pulse intact; normal sensation, pain with empty can test, however strength intact, pain with flexion, extension, internal rotation, no pain with external rotation of the shoulder  Lymphadenopathy:    She has no cervical adenopathy.  Neurological: She is alert. Coordination normal.  Skin: Skin is warm and dry. No rash noted. She is not diaphoretic. No pallor.  Psychiatric: She has a normal mood and affect.  Nursing note and vitals reviewed.    ED Treatments / Results  Labs (all labs ordered are listed, but only abnormal results are displayed) Labs Reviewed - No data to display  EKG  EKG Interpretation None       Radiology No results found.  Procedures Procedures (including critical care time)  Medications Ordered in ED Medications - No data to display   Initial Impression / Assessment and Plan / ED Course  I have reviewed the triage vital signs and the nursing notes.  Pertinent labs & imaging results that were available during my care of the patient were reviewed by me and considered in my medical decision making (see chart for details).     Suspect rotator cuff injury, no bony tenderness or trauma. No indication for imaging at this time. Patient agrees. We will place patient in a sling, give range of motion exercises. supportive and Tylenol. Supportive treatment discussed including ice and rest. Patient to follow-up with sports medicine, Dr. Pearletha ForgeHudnall. Return precautions discussed. Patient understands and agrees with plan. Patient vitals stable throughout ED course and discharged in satisfactory condition.   Final Clinical Impressions(s) / ED Diagnoses   Final  diagnoses:  Acute pain of left shoulder    New Prescriptions Current Discharge Medication List       Verdis PrimeLaw, Amaan Meyer M, PA-C 03/17/17 1638    Loren RacerYelverton, David, MD 03/21/17 [phone]

## 2017-04-13 ENCOUNTER — Encounter (HOSPITAL_BASED_OUTPATIENT_CLINIC_OR_DEPARTMENT_OTHER): Payer: Self-pay | Admitting: Emergency Medicine

## 2017-04-13 ENCOUNTER — Emergency Department (HOSPITAL_BASED_OUTPATIENT_CLINIC_OR_DEPARTMENT_OTHER)
Admission: EM | Admit: 2017-04-13 | Discharge: 2017-04-13 | Disposition: A | Discharge status: Home / Self Care | Payer: Self-pay | Attending: Emergency Medicine | Admitting: Emergency Medicine

## 2017-04-13 DIAGNOSIS — M545 Low back pain: Secondary | ICD-10-CM | POA: Insufficient documentation

## 2017-04-13 LAB — URINALYSIS, MICROSCOPIC (REFLEX)

## 2017-04-13 LAB — URINALYSIS, ROUTINE W REFLEX MICROSCOPIC
BILIRUBIN URINE: NEGATIVE
Glucose, UA: NEGATIVE mg/dL
KETONES UR: NEGATIVE mg/dL
Leukocytes, UA: NEGATIVE
NITRITE: NEGATIVE
PROTEIN: NEGATIVE mg/dL
Specific Gravity, Urine: >1.03 — ABNORMAL HIGH (ref 1.005–1.030)
pH: 5.5 (ref 5.0–8.0)

## 2017-04-13 LAB — PREGNANCY, URINE: PREG TEST UR: NEGATIVE

## 2017-04-13 NOTE — ED Triage Notes (Signed)
Right lower back pain x 4 weeks ago and for the last week has had bil lower back pain. Dysuria since yesterday

## 2017-04-13 NOTE — ED Notes (Signed)
Pt in waiting room, asking about wait. Pt upset about other pts going back who have not been waiting as long. Pt informed as this is an ED, we have to take possible life threatening pts to rooms first. Assured pt we will bring her to a room as soon as possible and apologized for the wait.

## 2017-09-19 ENCOUNTER — Encounter (HOSPITAL_BASED_OUTPATIENT_CLINIC_OR_DEPARTMENT_OTHER): Payer: Self-pay

## 2017-09-19 ENCOUNTER — Emergency Department (HOSPITAL_BASED_OUTPATIENT_CLINIC_OR_DEPARTMENT_OTHER)
Admission: EM | Admit: 2017-09-19 | Discharge: 2017-09-20 | Disposition: A | Discharge status: Home / Self Care | Payer: No Typology Code available for payment source | Attending: Emergency Medicine | Admitting: Emergency Medicine

## 2017-09-19 ENCOUNTER — Other Ambulatory Visit: Payer: Self-pay

## 2017-09-19 DIAGNOSIS — Y999 Unspecified external cause status: Secondary | ICD-10-CM | POA: Diagnosis not present

## 2017-09-19 DIAGNOSIS — Z79899 Other long term (current) drug therapy: Secondary | ICD-10-CM | POA: Diagnosis not present

## 2017-09-19 DIAGNOSIS — Y939 Activity, unspecified: Secondary | ICD-10-CM | POA: Diagnosis not present

## 2017-09-19 DIAGNOSIS — S76911A Strain of unspecified muscles, fascia and tendons at thigh level, right thigh, initial encounter: Secondary | ICD-10-CM | POA: Diagnosis not present

## 2017-09-19 DIAGNOSIS — S8991XA Unspecified injury of right lower leg, initial encounter: Secondary | ICD-10-CM | POA: Diagnosis present

## 2017-09-19 DIAGNOSIS — S8391XA Sprain of unspecified site of right knee, initial encounter: Secondary | ICD-10-CM | POA: Diagnosis not present

## 2017-09-19 DIAGNOSIS — Y929 Unspecified place or not applicable: Secondary | ICD-10-CM | POA: Diagnosis not present

## 2017-09-19 DIAGNOSIS — F17200 Nicotine dependence, unspecified, uncomplicated: Secondary | ICD-10-CM | POA: Diagnosis not present

## 2017-09-19 DIAGNOSIS — Z7982 Long term (current) use of aspirin: Secondary | ICD-10-CM | POA: Diagnosis not present
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
## 2017-09-20 ENCOUNTER — Emergency Department (HOSPITAL_BASED_OUTPATIENT_CLINIC_OR_DEPARTMENT_OTHER): Payer: No Typology Code available for payment source

## 2017-09-20 MED ORDER — METHOCARBAMOL 500 MG PO TABS: 500.0000 mg | ORAL_TABLET | Freq: Three times a day (TID) | ORAL | 0 refills | Status: DC | PRN

## 2017-09-20 MED ORDER — IBUPROFEN 800 MG PO TABS
800.0000 mg | ORAL_TABLET | Freq: Once | ORAL | Status: AC
Start: 2017-09-20 — End: 2017-09-20
  Administered 2017-09-20: 800 mg via ORAL
  Filled 2017-09-20: qty 1

## 2017-09-20 NOTE — ED Notes (Signed)
Pt discharged to home with family. NAD.  

## 2017-09-20 NOTE — ED Notes (Signed)
EDP into room, prior to RN assessment, see MD notes, orders received and initiated.   
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
## 2017-09-20 NOTE — Discharge Instructions (Signed)
You may alternate Tylenol 1000 mg every 6 hours as needed for pain and Ibuprofen 800 mg every 8 hours as needed for pain.  Please take Ibuprofen with food. ° ° ° °To find a primary care or specialty doctor please call [phone] or [phone] to access "[facility] Find a Doctor Service." ° °You may also go on the [facility] website at [email or web address] ° °There are also multiple [facility], [facility] and [facility] practices throughout the [place] that are frequently accepting new patients. You may find a clinic that is close to your home and contact them. ° °[facility] -  °[address] °[place] [address] °[phone] ° ° °[facility] -  °[address] °[place] [address] °[phone] ° ° °[facility] - °[address]  °[place] [address] °[phone] ° ° °

## 2017-09-20 NOTE — ED Notes (Signed)
EDP into room to discuss results and d/c plan 
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
## 2018-01-27 ENCOUNTER — Encounter (HOSPITAL_BASED_OUTPATIENT_CLINIC_OR_DEPARTMENT_OTHER): Payer: Self-pay | Admitting: Emergency Medicine

## 2018-01-27 ENCOUNTER — Other Ambulatory Visit: Payer: Self-pay

## 2018-01-27 ENCOUNTER — Emergency Department (HOSPITAL_BASED_OUTPATIENT_CLINIC_OR_DEPARTMENT_OTHER)
Admission: EM | Admit: 2018-01-27 | Discharge: 2018-01-27 | Disposition: A | Discharge status: Home / Self Care | Payer: Self-pay | Attending: Emergency Medicine | Admitting: Emergency Medicine

## 2018-01-27 DIAGNOSIS — Z7982 Long term (current) use of aspirin: Secondary | ICD-10-CM | POA: Insufficient documentation

## 2018-01-27 DIAGNOSIS — L247 Irritant contact dermatitis due to plants, except food: Secondary | ICD-10-CM | POA: Insufficient documentation

## 2018-01-27 DIAGNOSIS — Z79899 Other long term (current) drug therapy: Secondary | ICD-10-CM | POA: Insufficient documentation

## 2018-01-27 DIAGNOSIS — F17200 Nicotine dependence, unspecified, uncomplicated: Secondary | ICD-10-CM | POA: Insufficient documentation

## 2018-01-27 HISTORY — DX: Cerebral infarction, unspecified: I63.9

## 2018-01-27 HISTORY — DX: Transient cerebral ischemic attack, unspecified: G45.9

## 2018-01-27 MED ORDER — TRIAMCINOLONE ACETONIDE 0.1 % EX CREA: 1.0000 "application " | TOPICAL_CREAM | Freq: Two times a day (BID) | CUTANEOUS | 0 refills | Status: DC

## 2018-01-27 MED ORDER — TRIAMCINOLONE ACETONIDE 0.1 % EX CREA: 1.0000 "application " | TOPICAL_CREAM | Freq: Two times a day (BID) | CUTANEOUS | 0 refills | Status: AC

## 2018-01-27 MED ORDER — PREDNISONE 50 MG PO TABS: 60.0000 mg | ORAL_TABLET | Freq: Once | ORAL | Status: DC

## 2018-01-27 MED ORDER — PREDNISONE 10 MG (21) PO TBPK: ORAL_TABLET | Freq: Every day | ORAL | 0 refills | Status: DC

## 2018-01-27 NOTE — ED Provider Notes (Signed)
[facility] EMERGENCY DEPARTMENT Provider Note   CSN: [id number] Arrival date & time: 01/27/18  1953     History   Chief Complaint Chief Complaint  Patient presents with  . Rash    HPI Regina Thomas is a 47 y.o. female.  HPI  Regina Thomas is a 47 y.o. female presents to emergency department with complaint of rash to bilateral ankles, feet, lower legs.  Patient states that she was playing in the yard with her family when she noticed it had some poison ivy and poison sumac.  She states shortly after she developed a few small bumps to her left ankle.  Since then the rash has spread to bilateral medial feet, and left ankle and left shin.  She has developed large blisters, 1 of them popped and leaking clear fluid.  He has been putting calamine lotion which has not helped.  He states that she has had allergic reaction similar to this before to poison ivy and poison sumac.  Rash is very itchy.   Past Medical History:  Diagnosis Date  . Hiatal hernia   . Stroke (HCC)   . TIA (transient ischemic attack)     There are no active problems to display for this patient.   Past Surgical History:  Procedure Laterality Date  . APPENDECTOMY  1973  . CESAREAN SECTION  1993, 1997  . INNER EAR SURGERY     tubes   . KIDNEY SURGERY  1973  . TONSILLECTOMY  1976     OB History   None      Home Medications    Prior to Admission medications   Medication Sig Start Date End Date Taking? Authorizing Provider  aspirin 81 MG tablet Take 81 mg by mouth daily.    [provider]  cyclobenzaprine (FLEXERIL) 10 MG tablet Take 1 tablet (10 mg total) by mouth 2 (two) times daily as needed for muscle spasms. 03/17/17   Law, Waylan Boga, PA-C  esomeprazole (NEXIUM) 10 MG packet Take 10 mg by mouth daily before breakfast.    [provider]  ibuprofen (ADVIL,MOTRIN) 600 MG tablet Take 1 tablet (600 mg total) by mouth every 6 (six) hours as needed. 03/01/17   Audry Pili, PA-C    methocarbamol (ROBAXIN) 500 MG tablet Take 1 tablet (500 mg total) by mouth every 8 (eight) hours as needed for muscle spasms. 09/20/17   Ward, Layla Maw, DO  naproxen (NAPROSYN) 500 MG tablet Take 1 tablet (500 mg total) by mouth 2 (two) times daily as needed for mild pain, moderate pain or headache (TAKE WITH MEALS.). 10/20/16   Street, Mercedes, PA-C  sulfamethoxazole-trimethoprim (BACTRIM DS,SEPTRA DS) 800-160 MG tablet Take 1 tablet by mouth 2 (two) times daily. 10/20/16   Street, [place], PA-C    Family History Family History  Problem Relation Age of Onset  . Hypertension Mother   . Diabetes Mother   . Hypertension Father   . Diabetes Father   . Cancer Father        melanoma  . Cancer Maternal Grandmother        lung  . Heart attack Maternal Grandfather   . Stroke Maternal Grandfather     Social History Social History   Tobacco Use  . Smoking status: Current Every Day Smoker    Packs/day: 0.50    Years: 15.00    Pack years: 7.50  . Smokeless tobacco: Never Used  Substance Use Topics  . Alcohol use: No  . Drug use:  No     Allergies   Codeine; Hydrocodone; Penicillins; Percocet [oxycodone-acetaminophen]; and Zithromax [azithromycin]   Review of Systems Review of Systems  Constitutional: Negative for chills and fever.  Skin: Positive for color change, rash and wound.  Neurological: Negative for headaches.  All other systems reviewed and are negative.    Physical Exam Updated Vital Signs BP (!) 155/90   Pulse 67   Temp 99.1 F (37.3 C) (Oral)   Resp 18   Ht 5\' 7"  (1.702 m)   Wt 68.9 kg (152 lb)   LMP 01/13/2018   SpO2 100%   BMI 23.81 kg/m   Physical Exam  Constitutional: She appears well-developed and well-nourished. No distress.  Eyes: Conjunctivae are normal.  Neck: Neck supple.  Neurological: She is alert.  Skin: Skin is warm and dry.  Erythematous papular rash to bilateral medial feet, medial ankles, left medial anterior shin.  There are  large blisters to the left medial ankle and distal medial shin.  One blister is open, the rest are intact.  No cellulitic changes.  Nursing note and vitals reviewed.    ED Treatments / Results  Labs (all labs ordered are listed, but only abnormal results are displayed) Labs Reviewed - No data to display  EKG None  Radiology No results found.  Procedures Procedures (including critical care time)  Medications Ordered in ED Medications - No data to display   Initial Impression / Assessment and Plan / ED Course  I have reviewed the triage vital signs and the nursing notes.  Pertinent labs & imaging results that were available during my care of the patient were reviewed by me and considered in my medical decision making (see chart for details).     Patient with a rash to bilateral ankle and feet consistent with contact dermatitis.  Discussed wound care.  Topical steroids and oral steroids.  Follow-up with family doctor as needed.  Return precautions discussed.  Vitals:   01/27/18 1959 01/27/18 2000 01/27/18 2045  BP:  (!) 155/90 (!) 142/93  Pulse:  67 62  Resp:  18 16  Temp:  99.1 F (37.3 C)   TempSrc:  Oral   SpO2:  100% 100%  Weight: 68.9 kg (152 lb)    Height: 5\' 7"  (1.702 m)       Final Clinical Impressions(s) / ED Diagnoses   Final diagnoses:  Irritant contact dermatitis due to plants, except food    ED Discharge Orders        Ordered    predniSONE (STERAPRED UNI-PAK 21 TAB) 10 MG (21) TBPK tablet  Daily     01/27/18 2038    triamcinolone cream (KENALOG) 0.1 %  2 times daily,   Status:  Discontinued     01/27/18 2038    triamcinolone cream (KENALOG) 0.1 %  2 times daily     01/27/18 2041       Jaynie CrumbleKirichenko, Betheny Suchecki, PA-C 01/27/18 2243    Maia PlanLong, Joshua G, MD 01/28/18 1113

## 2018-01-27 NOTE — ED Triage Notes (Signed)
Pt c/o rash/blisters to BLE since yesterday

## 2018-01-27 NOTE — Discharge Instructions (Signed)
Avoid popping blisters.  If blisters pop, apply antibiotic ointment.  Take prednisone as prescribed until all gone.  Apply Kenalog cream topically for itching.  Follow-up with your doctor as needed.

## 2018-08-23 ENCOUNTER — Emergency Department (HOSPITAL_BASED_OUTPATIENT_CLINIC_OR_DEPARTMENT_OTHER)
Admission: EM | Admit: 2018-08-23 | Discharge: 2018-08-23 | Disposition: A | Discharge status: Home / Self Care | Payer: Self-pay | Attending: Emergency Medicine | Admitting: Emergency Medicine

## 2018-08-23 ENCOUNTER — Encounter (HOSPITAL_BASED_OUTPATIENT_CLINIC_OR_DEPARTMENT_OTHER): Payer: Self-pay

## 2018-08-23 ENCOUNTER — Other Ambulatory Visit: Payer: Self-pay

## 2018-08-23 DIAGNOSIS — F17200 Nicotine dependence, unspecified, uncomplicated: Secondary | ICD-10-CM | POA: Insufficient documentation

## 2018-08-23 DIAGNOSIS — K0889 Other specified disorders of teeth and supporting structures: Secondary | ICD-10-CM | POA: Insufficient documentation

## 2018-08-23 DIAGNOSIS — Z79899 Other long term (current) drug therapy: Secondary | ICD-10-CM | POA: Insufficient documentation

## 2018-08-23 DIAGNOSIS — Z7982 Long term (current) use of aspirin: Secondary | ICD-10-CM | POA: Insufficient documentation

## 2018-08-23 MED ORDER — TRAMADOL HCL 50 MG PO TABS
50.0000 mg | ORAL_TABLET | Freq: Once | ORAL | Status: AC
Start: 2018-08-23 — End: 2018-08-23
  Administered 2018-08-23: 50 mg via ORAL
  Filled 2018-08-23: qty 1

## 2018-08-23 MED ORDER — NAPROXEN 500 MG PO TABS: 500.0000 mg | ORAL_TABLET | Freq: Two times a day (BID) | ORAL | 0 refills | Status: DC | PRN

## 2018-08-23 MED ORDER — AMOXICILLIN 500 MG PO CAPS: 500.0000 mg | ORAL_CAPSULE | Freq: Three times a day (TID) | ORAL | 0 refills | Status: DC

## 2018-08-23 NOTE — ED Triage Notes (Signed)
Pt states she broke a couple teeth on the R side and is now in severe pain. Pt taking OTC pain medication without relief.

## 2018-08-23 NOTE — ED Provider Notes (Signed)
[facility] EMERGENCY DEPARTMENT Provider Note   CSN: [id number] Arrival date & time: 08/23/18  1743     History   Chief Complaint Chief Complaint  Patient presents with  . Dental Pain    HPI Nickey Creasey is a 48 y.o. female.  The history is provided by the patient and medical records. No language interpreter was used.  Dental Pain  Associated symptoms: no fever     Iola Ritter is a 48 y.o. female who presents to the Emergency Department complaining of persistent, gradually worsening, right-sided, lower dental pain beginning a few days ago. Pt describes their pain as sharp and burning. Pt has been taking BC powder at home with minimal relief of pain. They are not currently followed by dentistry, does have an appointment in 2 weeks.  Pt denies facial swelling, fever, chills, difficulty breathing, difficulty swallowing.    Past Medical History:  Diagnosis Date  . Hiatal hernia   . Stroke (HCC)   . TIA (transient ischemic attack)     There are no active problems to display for this patient.   Past Surgical History:  Procedure Laterality Date  . APPENDECTOMY  1973  . CESAREAN SECTION  1993, 1997  . INNER EAR SURGERY     tubes   . KIDNEY SURGERY  1973  . TONSILLECTOMY  1976     OB History   No obstetric history on file.      Home Medications    Prior to Admission medications   Medication Sig Start Date End Date Taking? Authorizing Provider  amoxicillin (AMOXIL) 500 MG capsule Take 1 capsule (500 mg total) by mouth 3 (three) times daily. 08/23/18   Tahari Clabaugh, Chase Picket, PA-C  aspirin 81 MG tablet Take 81 mg by mouth daily.    [provider]  cyclobenzaprine (FLEXERIL) 10 MG tablet Take 1 tablet (10 mg total) by mouth 2 (two) times daily as needed for muscle spasms. 03/17/17   Law, Waylan Boga, PA-C  esomeprazole (NEXIUM) 10 MG packet Take 10 mg by mouth daily before breakfast.    [provider]  ibuprofen (ADVIL,MOTRIN) 600 MG tablet  Take 1 tablet (600 mg total) by mouth every 6 (six) hours as needed. 03/01/17   Audry Pili, PA-C  methocarbamol (ROBAXIN) 500 MG tablet Take 1 tablet (500 mg total) by mouth every 8 (eight) hours as needed for muscle spasms. 09/20/17   Marykathleen Russi, Layla Maw, DO  naproxen (NAPROSYN) 500 MG tablet Take 1 tablet (500 mg total) by mouth 2 (two) times daily as needed. 08/23/18   Jemari Hallum, Chase Picket, PA-C  predniSONE (STERAPRED UNI-PAK 21 TAB) 10 MG (21) TBPK tablet Take by mouth daily. Take 6 tabs by mouth daily  for 2 days, then 5 tabs for 2 days, then 4 tabs for 2 days, then 3 tabs for 2 days, 2 tabs for 2 days, then 1 tab by mouth daily for 2 days 01/27/18   Jaynie Crumble, PA-C  sulfamethoxazole-trimethoprim (BACTRIM DS,SEPTRA DS) 800-160 MG tablet Take 1 tablet by mouth 2 (two) times daily. 10/20/16   Street, [place], PA-C  triamcinolone cream (KENALOG) 0.1 % Apply 1 application topically 2 (two) times daily. 01/27/18   Jaynie Crumble, PA-C    Family History Family History  Problem Relation Age of Onset  . Hypertension Mother   . Diabetes Mother   . Hypertension Father   . Diabetes Father   . Cancer Father        melanoma  . Cancer Maternal  Grandmother        lung  . Heart attack Maternal Grandfather   . Stroke Maternal Grandfather     Social History Social History   Tobacco Use  . Smoking status: Current Every Day Smoker    Packs/day: 0.50    Years: 15.00    Pack years: 7.50  . Smokeless tobacco: Never Used  Substance Use Topics  . Alcohol use: No  . Drug use: No     Allergies   Codeine; Hydrocodone; Penicillins; Percocet [oxycodone-acetaminophen]; and Zithromax [azithromycin]   Review of Systems Review of Systems  Constitutional: Negative for fever.  HENT: Positive for dental problem. Negative for trouble swallowing.   Respiratory: Negative for shortness of breath.      Physical Exam Updated Vital Signs BP (!) 178/89 (BP Location: Left Arm)   Pulse 83   Temp  98.4 F (36.9 C)   Resp 18   Ht 5\' 7"  (1.702 m)   Wt 66.7 kg   LMP 07/11/2018   SpO2 100%   BMI 23.02 kg/m   Physical Exam Vitals signs and nursing note reviewed.  Constitutional:      General: She is not in acute distress.    Appearance: She is well-developed.  HENT:     Head: Normocephalic and atraumatic.     Mouth/Throat:      Comments: Dental cavities and poor oral dentition noted. Pain along tooth  as depicted in image. No abscess noted. Midline uvula. No trismus. OP moist and clear. No oropharyngeal erythema or edema. Neck supple with no tenderness. No facial edema. Neck:     Musculoskeletal: Neck supple.  Cardiovascular:     Rate and Rhythm: Normal rate and regular rhythm.     Heart sounds: Normal heart sounds. No murmur.  Pulmonary:     Effort: Pulmonary effort is normal. No respiratory distress.     Breath sounds: Normal breath sounds. No wheezing or rales.  Musculoskeletal: Normal range of motion.  Skin:    General: Skin is warm and dry.  Neurological:     Mental Status: She is alert.      ED Treatments / Results  Labs (all labs ordered are listed, but only abnormal results are displayed) Labs Reviewed - No data to display  EKG None  Radiology No results found.  Procedures Procedures (including critical care time)  Medications Ordered in ED Medications  traMADol (ULTRAM) tablet 50 mg (has no administration in time range)     Initial Impression / Assessment and Plan / ED Course  I have reviewed the triage vital signs and the nursing notes.  Pertinent labs & imaging results that were available during my care of the patient were reviewed by me and considered in my medical decision making (see chart for details).    Tawny Hoppingracie Honse is a 48 y.o. female who presents to ED for dental pain. No abscess requiring immediate incision and drainage. Patient is afebrile, non toxic appearing, and swallowing secretions well. Exam not concerning for Ludwig's angina  or pharyngeal abscess. Will treat with amoxil. I provided dental resource guide and stressed the importance of dental follow up for ultimate management of dental pain. Patient voices understanding and is agreeable to plan.  Final Clinical Impressions(s) / ED Diagnoses   Final diagnoses:  Pain, dental    ED Discharge Orders         Ordered    amoxicillin (AMOXIL) 500 MG capsule  3 times daily     08/23/18 1802  naproxen (NAPROSYN) 500 MG tablet  2 times daily PRN     08/23/18 1802           Jadarian Mckay, Chase Picket, PA-C 08/23/18 Reginia Naas, MD 08/24/18 [phone]

## 2018-08-23 NOTE — Discharge Instructions (Signed)
You have a dental infection. It is very important that you get evaluated by a dentist as soon as possible. Keep your appointment as scheduled. Naproxen as needed for pain. You can also take Tylenol if needed for further pain control. Take your full course of antibiotics. Read the instructions below.  Eat a soft or liquid diet and rinse your mouth out after meals with warm water. You should see a dentist or return here at once if you have increased swelling, increased pain or uncontrolled bleeding from the site of your injury.  SEEK MEDICAL CARE IF: You have worsening swelling around your tooth, in your face or neck.  You have a fever  If you are unable to open your mouth New or worsening symptoms develop, you have any additional concerns.

## 2018-08-26 ENCOUNTER — Emergency Department (HOSPITAL_BASED_OUTPATIENT_CLINIC_OR_DEPARTMENT_OTHER)
Admission: EM | Admit: 2018-08-26 | Discharge: 2018-08-26 | Disposition: A | Discharge status: Home / Self Care | Payer: Self-pay | Attending: Emergency Medicine | Admitting: Emergency Medicine

## 2018-08-26 ENCOUNTER — Encounter (HOSPITAL_BASED_OUTPATIENT_CLINIC_OR_DEPARTMENT_OTHER): Payer: Self-pay | Admitting: *Deleted

## 2018-08-26 ENCOUNTER — Other Ambulatory Visit: Payer: Self-pay

## 2018-08-26 DIAGNOSIS — K047 Periapical abscess without sinus: Secondary | ICD-10-CM | POA: Insufficient documentation

## 2018-08-26 DIAGNOSIS — Z7982 Long term (current) use of aspirin: Secondary | ICD-10-CM | POA: Insufficient documentation

## 2018-08-26 DIAGNOSIS — F17210 Nicotine dependence, cigarettes, uncomplicated: Secondary | ICD-10-CM | POA: Insufficient documentation

## 2018-08-26 DIAGNOSIS — R030 Elevated blood-pressure reading, without diagnosis of hypertension: Secondary | ICD-10-CM | POA: Insufficient documentation

## 2018-08-26 MED ORDER — AMOXICILLIN-POT CLAVULANATE 875-125 MG PO TABS: 1.0000 | ORAL_TABLET | Freq: Two times a day (BID) | ORAL | 0 refills | Status: AC

## 2018-08-26 MED ORDER — LIDOCAINE VISCOUS HCL 2 % MT SOLN: 15.0000 mL | OROMUCOSAL | 0 refills | Status: AC | PRN

## 2018-08-26 MED ORDER — CHLORHEXIDINE GLUCONATE 0.12 % MT SOLN: 15.0000 mL | Freq: Two times a day (BID) | OROMUCOSAL | 0 refills | Status: DC

## 2018-08-26 NOTE — ED Provider Notes (Signed)
[facility] EMERGENCY DEPARTMENT Provider Note   CSN: [id number] Arrival date & time: 08/26/18  1648    History   Chief Complaint Chief Complaint  Patient presents with  . Dental Pain    HPI Regina Thomas is a 48 y.o. female.     HPI  Regina Thomas is a 48 y.o. female who presents to the Emergency Department complaining of persistent, gradually worsening, right-sided, lower dental pain beginning one week ago. Pt describes their pain as throbbing and burning. Pt has been taking naproxen and amoxicillin at home with minimal relief of pain.  Patient has had some interim facial swelling along the right lower jaw as well as the development of reactive lymph node on the right side.  Pt denies fever, chills, difficulty breathing, difficulty swallowing.   Past Medical History:  Diagnosis Date  . Hiatal hernia   . Stroke (HCC)   . TIA (transient ischemic attack)     There are no active problems to display for this patient.   Past Surgical History:  Procedure Laterality Date  . APPENDECTOMY  1973  . CESAREAN SECTION  1993, 1997  . INNER EAR SURGERY     tubes   . KIDNEY SURGERY  1973  . TONSILLECTOMY  1976     OB History   No obstetric history on file.      Home Medications    Prior to Admission medications   Medication Sig Start Date End Date Taking? Authorizing Provider  amoxicillin (AMOXIL) 500 MG capsule Take 1 capsule (500 mg total) by mouth 3 (three) times daily. 08/23/18   Ward, Chase Picket, PA-C  aspirin 81 MG tablet Take 81 mg by mouth daily.    [provider]  esomeprazole (NEXIUM) 10 MG packet Take 10 mg by mouth daily before breakfast.    [provider]  ibuprofen (ADVIL,MOTRIN) 600 MG tablet Take 1 tablet (600 mg total) by mouth every 6 (six) hours as needed. 03/01/17   Audry Pili, PA-C  naproxen (NAPROSYN) 500 MG tablet Take 1 tablet (500 mg total) by mouth 2 (two) times daily as needed. 08/23/18   Ward, Chase Picket, PA-C    triamcinolone cream (KENALOG) 0.1 % Apply 1 application topically 2 (two) times daily. 01/27/18   Jaynie Crumble, PA-C    Family History Family History  Problem Relation Age of Onset  . Hypertension Mother   . Diabetes Mother   . Hypertension Father   . Diabetes Father   . Cancer Father        melanoma  . Cancer Maternal Grandmother        lung  . Heart attack Maternal Grandfather   . Stroke Maternal Grandfather     Social History Social History   Tobacco Use  . Smoking status: Current Every Day Smoker    Packs/day: 0.50    Years: 15.00    Pack years: 7.50  . Smokeless tobacco: Never Used  Substance Use Topics  . Alcohol use: No  . Drug use: No     Allergies   Codeine; Hydrocodone; Penicillins; Percocet [oxycodone-acetaminophen]; and Zithromax [azithromycin]   Review of Systems Review of Systems  Constitutional: Negative for chills and fever.  HENT: Positive for dental problem. Negative for trouble swallowing and voice change.   Respiratory: Negative for stridor.      Physical Exam Updated Vital Signs BP (!) 155/91   Pulse 66   Temp 98.7 F (37.1 C)   Resp 18   Ht 5\' 7"  (  1.702 m)   Wt 66 kg   SpO2 100%   BMI 22.79 kg/m   Physical Exam Vitals signs and nursing note reviewed.  Constitutional:      General: She is not in acute distress.    Appearance: She is well-developed. She is not diaphoretic.     Comments: Sitting comfortably in bed.  HENT:     Head: Normocephalic and atraumatic.     Mouth/Throat:     Comments: Dental cavities and poor oral dentition noted. Pain along tooth as depicted in image. No abscess noted, but surrounded gums macerated and soft. Midline uvula. No trismus. OP clear and moist. No oropharyngeal erythema or edema. Neck supple with no tenderness. No facial edema.  Patient has a right-sided reactive anterior cervical lymph node.  Eyes:     General:        Right eye: No discharge.        Left eye: No discharge.      Conjunctiva/sclera: Conjunctivae normal.     Comments: EOMs normal to gross examination.  Neck:     Musculoskeletal: Normal range of motion.  Cardiovascular:     Rate and Rhythm: Normal rate and regular rhythm.     Comments: Intact, 2+ radial pulse. Abdominal:     General: There is no distension.  Musculoskeletal: Normal range of motion.  Skin:    General: Skin is warm and dry.  Neurological:     Mental Status: She is alert.     Comments: Cranial nerves intact to gross observation. Patient moves extremities without difficulty.  Psychiatric:        Behavior: Behavior normal.        Thought Content: Thought content normal.        Judgment: Judgment normal.      ED Treatments / Results  Labs (all labs ordered are listed, but only abnormal results are displayed) Labs Reviewed - No data to display  EKG None  Radiology No results found.  Procedures Procedures (including critical care time)  Medications Ordered in ED Medications - No data to display   Initial Impression / Assessment and Plan / ED Course  I have reviewed the triage vital signs and the nursing notes.  Pertinent labs & imaging results that were available during my care of the patient were reviewed by me and considered in my medical decision making (see chart for details).        Regina Thomas is a 48 y.o. female who presents to ED for dental pain. No abscess requiring immediate incision and drainage. Patient is afebrile, non toxic appearing, and swallowing secretions well. Exam not concerning for Ludwig's angina or pharyngeal abscess. Will treat with Augmentin given the interim right-sided lower facial swelling and reactive lymphadenopathy while on amoxicillin.  Patient reports she seen her blood pressure elevated on naproxen.  I recommended Tylenol instead.  We will also treat with Peridex and viscous lidocaine swish and spit.  I provided dental resource guide and stressed the importance of dental follow up  for ultimate management of dental pain. Patient voices understanding and is agreeable to plan.  Final Clinical Impressions(s) / ED Diagnoses   Final diagnoses:  Dental infection  Elevated blood pressure reading without diagnosis of hypertension    ED Discharge Orders         Ordered    amoxicillin-clavulanate (AUGMENTIN) 875-125 MG tablet  Every 12 hours     08/26/18 1714    lidocaine (XYLOCAINE) 2 % solution  As needed  08/26/18 1714    chlorhexidine (PERIDEX) 0.12 % solution  2 times daily     08/26/18 1714           Delia Chimes 08/26/18 1719    Azalia Bilis, MD 08/27/18 [phone]

## 2018-08-26 NOTE — Discharge Instructions (Signed)
Please see the information and instructions below regarding your visit.  Your diagnoses today include:  1. Dental infection   2. Elevated blood pressure reading without diagnosis of hypertension    You have a dental infection. It is very important that you get evaluated by a dentist as soon as possible. Call tomorrow to schedule an appointment. Tylenol as needed for pain. Take your full course of antibiotics. Read the instructions below.  Tests performed today include: See side panel of your discharge paperwork for testing performed today. Vital signs are listed at the bottom of these instructions.   Medications prescribed:    Take any prescribed medications only as prescribed, and any over the counter medications only as directed on the packaging.  1. You are prescribed augmentin, an antibiotic. Please take all of your antibiotics until finished.   You may develop abdominal discomfort or nausea from the antibiotic. If this occurs, you may take it with food. Some patients also get diarrhea with antibiotics. You may help offset this with probiotics which you can buy or get in yogurt. Do not eat or take the probiotics until 2 hours after your antibiotic. Some women develop vaginal yeast infections after antibiotics. If you develop unusual vaginal discharge after being on this medication, please see your primary care provider.   Some people develop allergies to antibiotics. Symptoms of antibiotic allergy can be mild and include a flat rash and itching. They can also be more serious and include:  ?Hives - Hives are raised, red patches of skin that are usually very itchy.  ?Lip or tongue swelling  ?Trouble swallowing or breathing  ?Blistering of the skin or mouth.  If you have any of these serious symptoms, please seek emergency medical care immediately.  2.  I recommend Tylenol, 650 mg every 6 hours as needed for pain.  Do not exceed 4000 gm in one day.  3. You are prescribed viscous  lidocaine swish and spit every 6 hours as needed for tooth and gum discomfort. Do not swallow.   4. You are prescribed chlorhexidine solution swish and spit twice daily. Do not swallow. This is to clear bacteria from your mouth.    Home care instructions:  Please follow any educational materials contained in this packet.   Eat a soft or liquid diet and rinse your mouth out after meals with warm water. You should see a dentist or return here at once if you have increased swelling, increased pain or uncontrolled bleeding from the site of your injury.  Follow-up instructions: It is very important that you see a dentist as soon as possible. There is a list of dentists attached to this packet if you do not have care established with a dentist already. Please give a call to a dentist of your choice tomorrow.  Return instructions:  Please return to the Emergency Department if you experience worsening symptoms.  Please seek care if you note any of the following about your dental pain:  You have increased pain not controlled with medicines.  You have swelling around your tooth, in your face or neck.  You have bleeding which starts, continues, or gets worse.  You have a fever >101 If you are unable to open your mouth Please return if you have any other emergent concerns.  Additional Information:   Your vital signs today were: BP (!) 155/91    Pulse 66    Temp 98.7 F (37.1 C)    Resp 18    Ht 5'  7" (1.702 m)    Wt 66 kg    SpO2 100%    BMI 22.79 kg/m  If your blood pressure (BP) was elevated on multiple readings during this visit above 130 for the top number or above 80 for the bottom number, please have this repeated by your primary care provider within one month. --------------  Thank you for allowing [place] to participate in your care today.

## 2018-08-26 NOTE — ED Triage Notes (Signed)
Pt returns for dental pain

## 2018-09-07 ENCOUNTER — Other Ambulatory Visit: Payer: Self-pay

## 2018-09-07 ENCOUNTER — Encounter (HOSPITAL_BASED_OUTPATIENT_CLINIC_OR_DEPARTMENT_OTHER): Payer: Self-pay | Admitting: Emergency Medicine

## 2018-09-07 ENCOUNTER — Emergency Department (HOSPITAL_BASED_OUTPATIENT_CLINIC_OR_DEPARTMENT_OTHER)
Admission: EM | Admit: 2018-09-07 | Discharge: 2018-09-07 | Disposition: A | Discharge status: Home / Self Care | Payer: Self-pay | Attending: Emergency Medicine | Admitting: Emergency Medicine

## 2018-09-07 DIAGNOSIS — S0185XA Open bite of other part of head, initial encounter: Secondary | ICD-10-CM | POA: Insufficient documentation

## 2018-09-07 DIAGNOSIS — Y9389 Activity, other specified: Secondary | ICD-10-CM | POA: Insufficient documentation

## 2018-09-07 DIAGNOSIS — Z7982 Long term (current) use of aspirin: Secondary | ICD-10-CM | POA: Insufficient documentation

## 2018-09-07 DIAGNOSIS — Z23 Encounter for immunization: Secondary | ICD-10-CM | POA: Insufficient documentation

## 2018-09-07 DIAGNOSIS — Z2914 Encounter for prophylactic rabies immune globin: Secondary | ICD-10-CM | POA: Insufficient documentation

## 2018-09-07 DIAGNOSIS — Y929 Unspecified place or not applicable: Secondary | ICD-10-CM | POA: Insufficient documentation

## 2018-09-07 DIAGNOSIS — F17200 Nicotine dependence, unspecified, uncomplicated: Secondary | ICD-10-CM | POA: Insufficient documentation

## 2018-09-07 DIAGNOSIS — W540XXA Bitten by dog, initial encounter: Secondary | ICD-10-CM | POA: Insufficient documentation

## 2018-09-07 DIAGNOSIS — Z79899 Other long term (current) drug therapy: Secondary | ICD-10-CM | POA: Insufficient documentation

## 2018-09-07 DIAGNOSIS — Y999 Unspecified external cause status: Secondary | ICD-10-CM | POA: Insufficient documentation

## 2018-09-07 LAB — PREGNANCY, URINE: Preg Test, Ur: NEGATIVE

## 2018-09-07 MED ORDER — RABIES VACCINE, PCEC IM SUSR
1.0000 mL | Freq: Once | INTRAMUSCULAR | Status: AC
  Administered 2018-09-07: 1 mL via INTRAMUSCULAR
  Filled 2018-09-07: qty 1

## 2018-09-07 MED ORDER — AMOXICILLIN-POT CLAVULANATE 875-125 MG PO TABS
1.0000 | ORAL_TABLET | Freq: Once | ORAL | Status: AC
  Administered 2018-09-07: 1 via ORAL
  Filled 2018-09-07: qty 1

## 2018-09-07 MED ORDER — RABIES IMMUNE GLOBULIN 150 UNIT/ML IM INJ
20.0000 [IU]/kg | INJECTION | Freq: Once | INTRAMUSCULAR | Status: AC
  Administered 2018-09-07: 1350 [IU] via INTRAMUSCULAR
  Filled 2018-09-07: qty 10

## 2018-09-07 MED ORDER — AMOXICILLIN-POT CLAVULANATE 875-125 MG PO TABS: 1.0000 | ORAL_TABLET | Freq: Two times a day (BID) | ORAL | 0 refills | Status: DC

## 2018-09-07 MED ORDER — TETANUS-DIPHTH-ACELL PERTUSSIS 5-2.5-18.5 LF-MCG/0.5 IM SUSP
0.5000 mL | Freq: Once | INTRAMUSCULAR | Status: AC
  Administered 2018-09-07: 0.5 mL via INTRAMUSCULAR
  Filled 2018-09-07: qty 0.5

## 2018-09-07 NOTE — ED Triage Notes (Addendum)
Reports dog bite to right side of face which occurred last night.  Dog was not known.  Unknown if dog is UTD on vaccines.  Unable to locate dog.

## 2018-09-07 NOTE — Discharge Instructions (Addendum)
Take Augmentin twice daily until completed.  Please wash the wound daily with warm soapy water.  Please return to the emergency department if you develop any increasing pain, redness, swelling, drainage, red streaking from the area, fevers over 100.4.  Please see your doctor or return here in 2 days for recheck if you you are developing any of the symptoms.  Please follow-up at the designated location for your subsequent rabies vaccinations.

## 2018-09-07 NOTE — ED Notes (Signed)
Pt tdap not UTD

## 2018-09-07 NOTE — ED Notes (Signed)
ED Provider at bedside. 

## 2018-09-07 NOTE — ED Provider Notes (Signed)
[facility] EMERGENCY DEPARTMENT Provider Note   CSN: [id number] Arrival date & time: 09/07/18  1752    History   Chief Complaint Chief Complaint  Patient presents with  . Animal Bite    HPI Regina Thomas is a 48 y.o. female with history of stroke, TIA, hiatal hernia who presents with dog bite to the face that occurred last evening.  Patient reports the dog came up to her on her porch and she reached down to check its tag and jumped up and bit her face.  She was unable to see any information about the dog before it ran away.  She had never seen it before.  She does not know anything about its vaccination status.  She is not up-to-date on tetanus.  She has had mild swelling around the bite on her face, but no significant pain to her jaw.  She put peroxide on it last night.    HPI  Past Medical History:  Diagnosis Date  . Hiatal hernia   . Stroke (HCC)   . TIA (transient ischemic attack)     There are no active problems to display for this patient.   Past Surgical History:  Procedure Laterality Date  . APPENDECTOMY  1973  . CESAREAN SECTION  1993, 1997  . INNER EAR SURGERY     tubes   . KIDNEY SURGERY  1973  . TONSILLECTOMY  1976     OB History   No obstetric history on file.      Home Medications    Prior to Admission medications   Medication Sig Start Date End Date Taking? Authorizing Provider  amoxicillin (AMOXIL) 500 MG capsule Take 1 capsule (500 mg total) by mouth 3 (three) times daily. 08/23/18   Ward, Chase PicketJaime Pilcher, PA-C  amoxicillin-clavulanate (AUGMENTIN) 875-125 MG tablet Take 1 tablet by mouth every 12 (twelve) hours. 09/07/18   Emi HolesLaw, Tiffony Kite M, PA-C  aspirin 81 MG tablet Take 81 mg by mouth daily.    [provider]  chlorhexidine (PERIDEX) 0.12 % solution Use as directed 15 mLs in the mouth or throat 2 (two) times daily. Swish and spit.  Do not swallow. 08/26/18   Aviva KluverMurray, Alyssa B, PA-C  esomeprazole (NEXIUM) 10 MG packet Take 10 mg by  mouth daily before breakfast.    [provider]  ibuprofen (ADVIL,MOTRIN) 600 MG tablet Take 1 tablet (600 mg total) by mouth every 6 (six) hours as needed. 03/01/17   Audry PiliMohr, Tyler, PA-C  lidocaine (XYLOCAINE) 2 % solution Use as directed 15 mLs in the mouth or throat as needed for mouth pain. Swish and spit.  Do not swallow. 08/26/18   Aviva KluverMurray, Alyssa B, PA-C  naproxen (NAPROSYN) 500 MG tablet Take 1 tablet (500 mg total) by mouth 2 (two) times daily as needed. 08/23/18   Ward, Chase PicketJaime Pilcher, PA-C  triamcinolone cream (KENALOG) 0.1 % Apply 1 application topically 2 (two) times daily. 01/27/18   Jaynie CrumbleKirichenko, Tatyana, PA-C    Family History Family History  Problem Relation Age of Onset  . Hypertension Mother   . Diabetes Mother   . Hypertension Father   . Diabetes Father   . Cancer Father        melanoma  . Cancer Maternal Grandmother        lung  . Heart attack Maternal Grandfather   . Stroke Maternal Grandfather     Social History Social History   Tobacco Use  . Smoking status: Current Every Day Smoker  Packs/day: 0.50    Years: 15.00    Pack years: 7.50  . Smokeless tobacco: Never Used  Substance Use Topics  . Alcohol use: No  . Drug use: No     Allergies   Codeine; Hydrocodone; Penicillins; Percocet [oxycodone-acetaminophen]; and Zithromax [azithromycin]   Review of Systems Review of Systems  Constitutional: Negative for fever.  Skin: Positive for wound.     Physical Exam Updated Vital Signs BP (!) 143/91 (BP Location: Left Arm)   Pulse 85   Temp 98.2 F (36.8 C) (Oral)   Resp 16   Ht 5\' 7"  (1.702 m)   Wt 68.5 kg   LMP 09/07/2018   SpO2 100%   BMI 23.65 kg/m   Physical Exam Vitals signs and nursing note reviewed.  Constitutional:      General: She is not in acute distress.    Appearance: She is well-developed. She is not diaphoretic.  HENT:     Head: Normocephalic and atraumatic.      Mouth/Throat:     Pharynx: No oropharyngeal exudate.    Eyes:     General: No scleral icterus.       Right eye: No discharge.        Left eye: No discharge.     Conjunctiva/sclera: Conjunctivae normal.     Pupils: Pupils are equal, round, and reactive to light.  Neck:     Musculoskeletal: Normal range of motion and neck supple.     Thyroid: No thyromegaly.  Cardiovascular:     Rate and Rhythm: Normal rate and regular rhythm.     Heart sounds: Normal heart sounds. No murmur. No friction rub. No gallop.   Pulmonary:     Effort: Pulmonary effort is normal. No respiratory distress.     Breath sounds: Normal breath sounds. No stridor. No wheezing or rales.  Abdominal:     General: Bowel sounds are normal. There is no distension.     Palpations: Abdomen is soft.     Tenderness: There is no abdominal tenderness. There is no guarding or rebound.  Lymphadenopathy:     Cervical: No cervical adenopathy.  Skin:    General: Skin is warm and dry.     Coloration: Skin is not pale.     Findings: No rash.  Neurological:     Mental Status: She is alert.     Coordination: Coordination normal.      ED Treatments / Results  Labs (all labs ordered are listed, but only abnormal results are displayed) Labs Reviewed  PREGNANCY, URINE    EKG None  Radiology No results found.  Procedures Procedures (including critical care time)  Medications Ordered in ED Medications  rabies immune globulin (HYPERAB/KEDRAB) injection 1,350 Units (1,350 Units Intramuscular Given 09/07/18 1929)  rabies vaccine (RABAVERT) injection 1 mL (1 mL Intramuscular Given 09/07/18 1930)  amoxicillin-clavulanate (AUGMENTIN) 875-125 MG per tablet 1 tablet (1 tablet Oral Given 09/07/18 1932)  Tdap (BOOSTRIX) injection 0.5 mL (0.5 mLs Intramuscular Given 09/07/18 1930)     Initial Impression / Assessment and Plan / ED Course  I have reviewed the triage vital signs and the nursing notes.  Pertinent labs & imaging results that were available during my care of the patient were  reviewed by me and considered in my medical decision making (see chart for details).        Patient presenting with superficial dog bite to face.  No signs of infection at this time.  Rabies immunoglobulin and vaccine given in  the ED as well as tetanus.  Augmentin initiated.  Discharge home with Augmentin.  Wound check recommended 2 days if any new symptoms develop including increasing pain, redness, swelling, drainage, red streaking from the wound, fevers.  Patient understands and agrees with plan.  Patient vitals stable, although blood pressure mildly elevated, discharged in satisfactory condition.  Patient advised to follow-up with PCP for recheck of blood pressure in the next 1 to 2 weeks.  Final Clinical Impressions(s) / ED Diagnoses   Final diagnoses:  Dog bite of face, initial encounter    ED Discharge Orders         Ordered    amoxicillin-clavulanate (AUGMENTIN) 875-125 MG tablet  Every 12 hours     09/07/18 1954           Emi Holes, PA-C 09/07/18 Regina Thomas    Geoffery Lyons, MD 09/07/18 2326

## 2018-09-10 ENCOUNTER — Encounter: Payer: Self-pay | Admitting: Emergency Medicine

## 2018-09-10 ENCOUNTER — Emergency Department (INDEPENDENT_AMBULATORY_CARE_PROVIDER_SITE_OTHER)
Admission: EM | Admit: 2018-09-10 | Discharge: 2018-09-10 | Disposition: A | Discharge status: Home / Self Care | Payer: Self-pay | Source: Home / Self Care | Attending: Emergency Medicine | Admitting: Emergency Medicine

## 2018-09-10 DIAGNOSIS — Z23 Encounter for immunization: Secondary | ICD-10-CM

## 2018-09-10 NOTE — ED Triage Notes (Addendum)
Rabies injection RT Deltoid

## 2018-09-14 ENCOUNTER — Encounter: Payer: Self-pay | Admitting: Emergency Medicine

## 2018-09-14 ENCOUNTER — Other Ambulatory Visit: Payer: Self-pay

## 2018-09-14 ENCOUNTER — Emergency Department (INDEPENDENT_AMBULATORY_CARE_PROVIDER_SITE_OTHER)
Admission: EM | Admit: 2018-09-14 | Discharge: 2018-09-14 | Disposition: A | Discharge status: Home / Self Care | Payer: Self-pay | Source: Home / Self Care

## 2018-09-14 DIAGNOSIS — Z203 Contact with and (suspected) exposure to rabies: Secondary | ICD-10-CM

## 2018-09-14 MED ORDER — RABIES VACCINE, PCEC IM SUSR
1.0000 mL | Freq: Once | INTRAMUSCULAR | Status: AC
  Administered 2018-09-14: 1 mL via INTRAMUSCULAR

## 2018-09-14 NOTE — ED Triage Notes (Signed)
Patient here for 4th in series Rabivert vaccine. Denies problems with last injection.

## 2018-09-22 ENCOUNTER — Emergency Department (INDEPENDENT_AMBULATORY_CARE_PROVIDER_SITE_OTHER)
Admission: EM | Admit: 2018-09-22 | Discharge: 2018-09-22 | Disposition: A | Discharge status: Home / Self Care | Payer: Self-pay | Source: Home / Self Care

## 2018-09-22 DIAGNOSIS — Z23 Encounter for immunization: Secondary | ICD-10-CM

## 2018-09-22 MED ORDER — RABIES VACCINE, PCEC IM SUSR
1.0000 mL | Freq: Once | INTRAMUSCULAR | Status: AC
  Administered 2018-09-22: 1 mL via INTRAMUSCULAR

## 2018-09-22 NOTE — ED Triage Notes (Signed)
Pt here for last rabies injection 

## 2018-11-25 ENCOUNTER — Emergency Department (HOSPITAL_BASED_OUTPATIENT_CLINIC_OR_DEPARTMENT_OTHER): Payer: Self-pay

## 2018-11-25 ENCOUNTER — Other Ambulatory Visit: Payer: Self-pay

## 2018-11-25 ENCOUNTER — Emergency Department (HOSPITAL_BASED_OUTPATIENT_CLINIC_OR_DEPARTMENT_OTHER)
Admission: EM | Admit: 2018-11-25 | Discharge: 2018-11-25 | Disposition: A | Discharge status: Home / Self Care | Payer: Self-pay | Attending: Emergency Medicine | Admitting: Emergency Medicine

## 2018-11-25 ENCOUNTER — Encounter (HOSPITAL_BASED_OUTPATIENT_CLINIC_OR_DEPARTMENT_OTHER): Payer: Self-pay | Admitting: Emergency Medicine

## 2018-11-25 DIAGNOSIS — F17210 Nicotine dependence, cigarettes, uncomplicated: Secondary | ICD-10-CM | POA: Insufficient documentation

## 2018-11-25 DIAGNOSIS — Z7982 Long term (current) use of aspirin: Secondary | ICD-10-CM | POA: Insufficient documentation

## 2018-11-25 DIAGNOSIS — Z79899 Other long term (current) drug therapy: Secondary | ICD-10-CM | POA: Insufficient documentation

## 2018-11-25 DIAGNOSIS — X503XXA Overexertion from repetitive movements, initial encounter: Secondary | ICD-10-CM | POA: Insufficient documentation

## 2018-11-25 DIAGNOSIS — Z8673 Personal history of transient ischemic attack (TIA), and cerebral infarction without residual deficits: Secondary | ICD-10-CM | POA: Insufficient documentation

## 2018-11-25 DIAGNOSIS — Y990 Civilian activity done for income or pay: Secondary | ICD-10-CM | POA: Insufficient documentation

## 2018-11-25 DIAGNOSIS — S46912A Strain of unspecified muscle, fascia and tendon at shoulder and upper arm level, left arm, initial encounter: Secondary | ICD-10-CM | POA: Insufficient documentation

## 2018-11-25 DIAGNOSIS — Y929 Unspecified place or not applicable: Secondary | ICD-10-CM | POA: Insufficient documentation

## 2018-11-25 DIAGNOSIS — Y93H1 Activity, digging, shoveling and raking: Secondary | ICD-10-CM | POA: Insufficient documentation

## 2018-11-25 MED ORDER — IBUPROFEN 400 MG PO TABS
400.0000 mg | ORAL_TABLET | Freq: Once | ORAL | Status: AC
  Administered 2018-11-25: 400 mg via ORAL
  Filled 2018-11-25: qty 1

## 2018-11-25 MED ORDER — NAPROXEN 500 MG PO TABS: 500.0000 mg | ORAL_TABLET | Freq: Two times a day (BID) | ORAL | 0 refills | Status: AC | PRN

## 2018-11-25 NOTE — ED Provider Notes (Signed)
[facility] EMERGENCY DEPARTMENT Provider Note   CSN: [id number] Arrival date & time: 11/25/18  0017    History   Chief Complaint Chief Complaint  Patient presents with  . Shoulder Pain    HPI Regina Thomas is a 48 y.o. female.   The history is provided by the patient.  She has history of transient ischemic attack and comes in complaining of left shoulder injury.  She works as a Nutritional therapist, and 2 weeks ago, she noted pain in her left shoulder after crawling underneath a house.  She was doing reasonably well until yesterday when she was using a shovel to fill a ditch and she felt something pop in the anterior aspect of her left shoulder.  Since then, she has had pain in the area just underneath her collarbone.  She initially had some numbness in the radial aspect of her arm, but that has resolved.  She rates pain at 7/10.  Acetaminophen has not been effective for pain relief.  She is tried applying ice and heat without any benefit.  Past Medical History:  Diagnosis Date  . Hiatal hernia   . Stroke (HCC)   . TIA (transient ischemic attack)     There are no active problems to display for this patient.   Past Surgical History:  Procedure Laterality Date  . APPENDECTOMY  1973  . CESAREAN SECTION  1993, 1997  . INNER EAR SURGERY     tubes   . KIDNEY SURGERY  1973  . TONSILLECTOMY  1976     OB History   No obstetric history on file.      Home Medications    Prior to Admission medications   Medication Sig Start Date End Date Taking? Authorizing Provider  amoxicillin (AMOXIL) 500 MG capsule Take 1 capsule (500 mg total) by mouth 3 (three) times daily. 08/23/18   Ward, Chase Picket, PA-C  amoxicillin-clavulanate (AUGMENTIN) 875-125 MG tablet Take 1 tablet by mouth every 12 (twelve) hours. 09/07/18   Emi Holes, PA-C  aspirin 81 MG tablet Take 81 mg by mouth daily.    [provider]  chlorhexidine (PERIDEX) 0.12 % solution Use as directed 15 mLs in the  mouth or throat 2 (two) times daily. Swish and spit.  Do not swallow. 08/26/18   Aviva Kluver B, PA-C  esomeprazole (NEXIUM) 10 MG packet Take 10 mg by mouth daily before breakfast.    [provider]  ibuprofen (ADVIL,MOTRIN) 600 MG tablet Take 1 tablet (600 mg total) by mouth every 6 (six) hours as needed. 03/01/17   Audry Pili, PA-C  lidocaine (XYLOCAINE) 2 % solution Use as directed 15 mLs in the mouth or throat as needed for mouth pain. Swish and spit.  Do not swallow. 08/26/18   Aviva Kluver B, PA-C  naproxen (NAPROSYN) 500 MG tablet Take 1 tablet (500 mg total) by mouth 2 (two) times daily as needed. 08/23/18   Ward, Chase Picket, PA-C  triamcinolone cream (KENALOG) 0.1 % Apply 1 application topically 2 (two) times daily. 01/27/18   Jaynie Crumble, PA-C    Family History Family History  Problem Relation Age of Onset  . Hypertension Mother   . Diabetes Mother   . Hypertension Father   . Diabetes Father   . Cancer Father        melanoma  . Cancer Maternal Grandmother        lung  . Heart attack Maternal Grandfather   . Stroke Maternal Grandfather  Social History Social History   Tobacco Use  . Smoking status: Current Every Day Smoker    Packs/day: 0.50    Years: 15.00    Pack years: 7.50  . Smokeless tobacco: Never Used  Substance Use Topics  . Alcohol use: No  . Drug use: No     Allergies   Codeine; Hydrocodone; Penicillins; Percocet [oxycodone-acetaminophen]; and Zithromax [azithromycin]   Review of Systems Review of Systems  All other systems reviewed and are negative.    Physical Exam Updated Vital Signs BP (!) 159/91 (BP Location: Right Arm)   Pulse 78   Temp 98 F (36.7 C) (Oral)   Resp 18   Ht 5\' 7"  (1.702 m)   Wt 68.9 kg   LMP 11/20/2018 (Exact Date)   SpO2 99%   BMI 23.81 kg/m   Physical Exam Vitals signs and nursing note reviewed.    48 year old female, resting comfortably and in no acute distress. Vital signs are  significant for elevated blood pressure. Oxygen saturation is 99%, which is normal. Head is normocephalic and atraumatic. PERRLA, EOMI. Oropharynx is clear. Neck is nontender and supple without adenopathy or JVD. Back is nontender and there is no CVA tenderness. Lungs are clear without rales, wheezes, or rhonchi. Chest has tenderness so fairly well localized to the pectoralis muscle just inferior to the lateral clavicle. Heart has regular rate and rhythm without murmur. Abdomen is soft, flat, nontender without masses or hepatosplenomegaly and peristalsis is normoactive. Extremities have no cyanosis or edema, full range of motion is present.  There is pain with passive range of motion of the left shoulder.  There is pain with active range of motion against resistance with worse pain being with abduction and internal rotation against resistance.  Distal neurovascular exam is intact with strong pulses, prompt capillary refill, normal sensation, normal strength of distal musculature. Skin is warm and dry without rash. Neurologic: Mental status is normal, cranial nerves are intact, there are no motor or sensory deficits.  ED Treatments / Results   Radiology Dg Shoulder Left  Result Date: 11/25/2018 CLINICAL DATA:  47 y/o F; 2 weeks of left shoulder pain. Felt arm pop while gardening. Pain of the left clavicle today. EXAM: LEFT SHOULDER - 2+ VIEW COMPARISON:  11/02/2015 left shoulder radiographs. FINDINGS: There is no evidence of fracture or dislocation. There is no evidence of arthropathy or other focal bone abnormality. Soft tissues are unremarkable. IMPRESSION: Negative. Electronically Signed   By: Mitzi Hansen M.D.   On: 11/25/2018 01:24    Procedures Procedures   Medications Ordered in ED Medications  ibuprofen (ADVIL) tablet 400 mg (has no administration in time range)     Initial Impression / Assessment and Plan / ED Course  I have reviewed the triage vital signs and the  nursing notes.  Pertinent imaging results that were available during my care of the patient were reviewed by me and considered in my medical decision making (see chart for details).  Left shoulder pain which appears to be strain of the left pectoralis major.  She will be sent for x-rays.  Old records are reviewed, and she has no relevant past visits.  She is given a dose of ibuprofen.  X-rays are unremarkable.  She is placed in a sling and given a work note for limited active use of the left arm for 1 week.  She is given a prescription for naproxen, advised to use over-the-counter acetaminophen as needed.  Referred to sports medicine  for follow-up.  Final Clinical Impressions(s) / ED Diagnoses   Final diagnoses:  Strain of left shoulder, initial encounter    ED Discharge Orders         Ordered    naproxen (NAPROSYN) 500 MG tablet  2 times daily PRN     11/25/18 0136           Dione BoozeGlick, Rainn Zupko, MD 11/25/18 [phone]

## 2018-11-25 NOTE — ED Triage Notes (Signed)
Pt states her left shoulder started bothering her a couple of weeks ago but yesterday while she was shoveling dirt she felt a pop along her collar bone area  Pain has been progressively gotten worse since

## 2018-11-25 NOTE — Discharge Instructions (Addendum)
Make a follow up appointment with either your orthopedic doctor or with Dr. Pearletha Forge.  Wear the sling as needed.  Apply ice for thirty minutes at a time, four times a day.  Take acetaminophen as needed for additional pain relief.  Twice a day, put your shoulder through a full range of motion. That is to prevent a frozen shoulder.

## 2018-11-25 NOTE — ED Notes (Signed)
ED Provider at bedside. 

## 2018-12-03 ENCOUNTER — Ambulatory Visit (INDEPENDENT_AMBULATORY_CARE_PROVIDER_SITE_OTHER): Payer: Self-pay | Admitting: Family Medicine

## 2018-12-03 ENCOUNTER — Ambulatory Visit: Payer: Self-pay

## 2018-12-03 ENCOUNTER — Other Ambulatory Visit: Payer: Self-pay

## 2018-12-03 ENCOUNTER — Encounter: Payer: Self-pay | Admitting: Family Medicine

## 2018-12-03 VITALS — BP 122/71 | HR 70 | Ht 67.0 in | Wt 151.0 lb

## 2018-12-03 DIAGNOSIS — M25512 Pain in left shoulder: Secondary | ICD-10-CM

## 2018-12-03 DIAGNOSIS — M65912 Unspecified synovitis and tenosynovitis, left shoulder: Secondary | ICD-10-CM | POA: Insufficient documentation

## 2018-12-03 DIAGNOSIS — M65812 Other synovitis and tenosynovitis, left shoulder: Secondary | ICD-10-CM | POA: Insufficient documentation

## 2018-12-03 MED ORDER — METHYLPREDNISOLONE ACETATE 40 MG/ML IJ SUSP
40.0000 mg | Freq: Once | INTRAMUSCULAR | Status: AC
  Administered 2018-12-03: 40 mg via INTRA_ARTICULAR

## 2018-12-03 NOTE — Assessment & Plan Note (Signed)
Pain likely associated with labral pathology.  She does have some hypermobility so unclear if she is having a mild subluxation as opposed to a clear labral tear.  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 -Glenohumeral joint injection. -Counseled on home exercise therapy and supportive care. -Provided work note to remain with office work for the next 4 weeks until her follow-up. -If no improvement need to consider advanced imaging or physical therapy

## 2018-12-03 NOTE — Patient Instructions (Signed)
Nice to meet you Please try the exercises once your pain has improved.  Please try ice on the area  Please try compression  Please send me a message in MyChart with any questions or updates.  Please see me back in 4 weeks.   --Dr. Jordan Likes

## 2018-12-03 NOTE — Addendum Note (Signed)
Addended by: Kathi Simpers F on: 12/03/2018 11:38 AM   Modules accepted: Orders

## 2018-12-03 NOTE — Progress Notes (Signed)
Regina Thomas - 48 y.o. female MRN [id number]  Date of birth: 12-30-1970  SUBJECTIVE:  Including CC & ROS.  Chief Complaint  Patient presents with  . Shoulder Pain    left shoulder x 5 weeks    Regina Thomas is a 48 y.o. female that is presenting with left anterior shoulder pain.  She was seen in the emergency department on 1 week ago.  She was shoveling and felt a pop in the anterior aspect of the shoulder.  Since that time she had some swelling and significant pain.  She is placed in an arm sling.  The pain is the same.  She is unable do many activities such as lifting with the left arm.  She has a history of rotator cuff tear in the same shoulder.  Denies any significant ecchymosis.  The pain is sharp and stabbing.  Feels pain with internal rotation..  Independent review of the left shoulder x-ray from 5/20 shows no significant bony abnormality.   Review of Systems  Constitutional: Negative for fever.  HENT: Negative for congestion.   Respiratory: Negative for cough.   Cardiovascular: Negative for chest pain.  Gastrointestinal: Negative for abdominal pain.  Musculoskeletal: Positive for arthralgias.  Skin: Negative for color change.  Neurological: Negative for weakness.  Hematological: Negative for adenopathy.    HISTORY: Past Medical, Surgical, Social, and Family History Reviewed & Updated per EMR.   Pertinent Historical Findings include:  Past Medical History:  Diagnosis Date  . Hiatal hernia   . Stroke (HCC)   . TIA (transient ischemic attack)     Past Surgical History:  Procedure Laterality Date  . APPENDECTOMY  1973  . CESAREAN SECTION  1993, 1997  . INNER EAR SURGERY     tubes   . KIDNEY SURGERY  1973  . TONSILLECTOMY  1976    Allergies  Allergen Reactions  . Codeine Nausea And Vomiting  . Hydrocodone Nausea And Vomiting  . Penicillins   . Percocet [Oxycodone-Acetaminophen] Nausea And Vomiting  . Zithromax [Azithromycin] Palpitations    zpack    Family  History  Problem Relation Age of Onset  . Hypertension Mother   . Diabetes Mother   . Hypertension Father   . Diabetes Father   . Cancer Father        melanoma  . Cancer Maternal Grandmother        lung  . Heart attack Maternal Grandfather   . Stroke Maternal Grandfather      Social History   Socioeconomic History  . Marital status: Legally Separated    Spouse name: Not on file  . Number of children: 2  . Years of education: 38  . 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 Financial resource strain: Not on file  . Food insecurity:    Worry: Not on file    Inability: Not on file  . Transportation needs:    Medical: Not on file    Non-medical: Not on file  Tobacco Use  . Smoking status: Current Every Day Smoker    Packs/day: 0.50    Years: 15.00    Pack years: 7.50  . Smokeless tobacco: Never Used  Substance and Sexual Activity  . Alcohol use: No  . Drug use: No  . Sexual activity: Yes    Birth control/protection: None  Lifestyle  . Physical activity:    Days per week: Not on file  Minutes per session: Not on file  . Stress: Not on file  Relationships  . Social connections:    Talks on phone: Not on file    Gets together: Not on file    Attends religious service: Not on file    Active member of club or organization: Not on file    Attends meetings of clubs or organizations: Not on file    Relationship status: Not on file  . Intimate partner violence:    Fear of current or ex partner: Not on file    Emotionally abused: Not on file    Physically abused: Not on file    Forced sexual activity: Not on file  Other Topics Concern  . Not on file  Social History Narrative   Lives at home with dgtr, son-in-law, babies   Caffeine use- coffee, 1 cup daily; tea, 4 glasses daily     PHYSICAL EXAM:  VS: BP 122/71   Pulse 70   Ht 5\' 7"  (1.702 m)   Wt 151 lb (68.5 kg)   LMP 11/20/2018 (Exact Date)    BMI 23.65 kg/m  Physical Exam Gen: NAD, alert, cooperative with exam, well-appearing ENT: normal lips, normal nasal mucosa,  Eye: normal EOM, normal conjunctiva and lids CV:  no edema, +2 pedal pulses   Resp: no accessory muscle use, non-labored,  Skin: no rashes, no areas of induration  Neuro: normal tone, normal sensation to touch Psych:  normal insight, alert and oriented MSK:  Left shoulder: Normal active range of motion. Normal strength resistance with internal and external rotation. Mild pain with empty can testing. Mild pain with speeds test. No ecchymosis or swelling. Tenderness to palpation over the coracoid process. Pain with O'Brien's testing. Neurovascular intact  Hyper extension in elbows bilaterally. Able to take thumbs to forearm and pinky finger past 90 degrees.  Limited ultrasound: Left shoulder:  Normal-appearing biceps tendon. Normal-appearing subscapularis and dynamic and static testing. Supraspinatus with no significant tearing but with chronic tendinopathy type changes. Small effusion appears to be occurring in the posterior glenohumeral joint.  Summary: Findings are suggestive of a labral tear  Ultrasound and interpretation by Clare GandyJeremy , MD   Aspiration/Injection Procedure Note Tawny Hoppingracie Emmons 1970-08-02  Procedure: Injection Indications: Left shoulder pain  Procedure Details Consent: Risks of procedure as well as the alternatives and risks of each were explained to the (patient/caregiver).  Consent for procedure obtained. Time Out: Verified patient identification, verified procedure, site/side was marked, verified correct patient position, special equipment/implants available, medications/allergies/relevent history reviewed, required imaging and test results available.  Performed.  The area was cleaned with iodine and alcohol swabs.    The left glenohumeral joint was injected using 1 cc's of 40 mg Depo-Medrol and 4 cc's of 0.5% bupivacaine with  a 21 2" needle.  Ultrasound was used. Images were obtained in trans views showing the injection.     A sterile dressing was applied.  Patient did tolerate procedure well.      ASSESSMENT & PLAN:   Acute pain of left shoulder Pain likely associated with labral pathology.  She does have some hypermobility so unclear if she is having a mild subluxation as opposed to a clear labral tear.  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 -Glenohumeral joint injection. -Counseled on home exercise therapy and supportive care. -Provided work note to remain with office work for the next 4 weeks until her follow-up. -If no improvement need to consider advanced imaging or physical  therapy

## 2018-12-31 ENCOUNTER — Encounter: Payer: Self-pay | Admitting: Family Medicine

## 2018-12-31 ENCOUNTER — Ambulatory Visit (INDEPENDENT_AMBULATORY_CARE_PROVIDER_SITE_OTHER): Payer: Self-pay | Admitting: Family Medicine

## 2018-12-31 ENCOUNTER — Other Ambulatory Visit: Payer: Self-pay

## 2018-12-31 DIAGNOSIS — S43432D Superior glenoid labrum lesion of left shoulder, subsequent encounter: Secondary | ICD-10-CM

## 2018-12-31 NOTE — Patient Instructions (Signed)
Good to see you Please try to ice and use the brace  Please use tylenol or ibuprofen   Please send me a message in MyChart with any questions or updates.  I will call with the results once the MRI is completed.   --Dr. Raeford Razor

## 2018-12-31 NOTE — Progress Notes (Signed)
Regina Thomas - 48 y.o. female MRN [id number]  Date of birth: Jan 08, 1971  SUBJECTIVE:  Including CC & ROS.  Chief Complaint  Patient presents with  . Follow-up    follow up for left shoulder    Regina Thomas is a 48 y.o. female that is following up for her ongoing left shoulder pain.  She received a glenohumeral injection received improvement of her pain for a few hours.  She continues to have pain when she is moving it.  She has worn a shoulder brace and that has improved some of her symptoms.  Her symptoms of pain are intermittent in nature.  They are localized anteriorly at the shoulder and chest.  Associated with picking things up.    Review of Systems  Constitutional: Negative for fever.  HENT: Negative for congestion.   Respiratory: Negative for cough.   Cardiovascular: Negative for chest pain.  Gastrointestinal: Negative for abdominal pain.  Musculoskeletal: Negative for gait problem.  Neurological: Negative for syncope.  Hematological: Negative for adenopathy.    HISTORY: Past Medical, Surgical, Social, and Family History Reviewed & Updated per EMR.   Pertinent Historical Findings include:  Past Medical History:  Diagnosis Date  . Hiatal hernia   . Stroke (HCC)   . TIA (transient ischemic attack)     Past Surgical History:  Procedure Laterality Date  . APPENDECTOMY  1973  . CESAREAN SECTION  1993, 1997  . INNER EAR SURGERY     tubes   . KIDNEY SURGERY  1973  . TONSILLECTOMY  1976    Allergies  Allergen Reactions  . Codeine Nausea And Vomiting  . Hydrocodone Nausea And Vomiting  . Penicillins   . Percocet [Oxycodone-Acetaminophen] Nausea And Vomiting  . Zithromax [Azithromycin] Palpitations    zpack    Family History  Problem Relation Age of Onset  . Hypertension Mother   . Diabetes Mother   . Hypertension Father   . Diabetes Father   . Cancer Father        melanoma  . Cancer Maternal Grandmother        lung  . Heart attack Maternal Grandfather    . Stroke Maternal Grandfather      Social History   Socioeconomic History  . Marital status: Legally Separated    Spouse name: Not on file  . Number of children: 2  . Years of education: 1913  . 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 Financial resource strain: Not on file  . Food insecurity    Worry: Not on file    Inability: Not on file  . Transportation needs    Medical: Not on file    Non-medical: Not on file  Tobacco Use  . Smoking status: Current Every Day Smoker    Packs/day: 0.50    Years: 15.00    Pack years: 7.50  . Smokeless tobacco: Never Used  Substance and Sexual Activity  . Alcohol use: No  . Drug use: No  . Sexual activity: Yes    Birth control/protection: None  Lifestyle  . Physical activity    Days per week: Not on file    Minutes per session: Not on file  . Stress: Not on file  Relationships  . Social Musicianconnections    Talks on phone: Not on file    Gets together: Not on file    Attends religious service: Not on file    Active member  of club or organization: Not on file    Attends meetings of clubs or organizations: Not on file    Relationship status: Not on file  . Intimate partner violence    Fear of current or ex partner: Not on file    Emotionally abused: Not on file    Physically abused: Not on file    Forced sexual activity: Not on file  Other Topics Concern  . Not on file  Social History Narrative   Lives at home with dgtr, son-in-law, babies   Caffeine use- coffee, 1 cup daily; tea, 4 glasses daily     PHYSICAL EXAM:  VS: BP 111/82   Pulse 71   Ht 5\' 7"  (1.702 m)   Wt 150 lb (68 kg)   BMI 23.49 kg/m  Physical Exam Gen: NAD, alert, cooperative with exam, well-appearing ENT: normal lips, normal nasal mucosa,  Eye: normal EOM, normal conjunctiva and lids CV:  no edema, +2 pedal pulses   Resp: no accessory muscle use, non-labored,   Skin: no rashes, no areas of  induration  Neuro: normal tone, normal sensation to touch Psych:  normal insight, alert and oriented MSK:  Left shoulder:  Normal active flexion and abduction  Normal strength resistance with internal and external rotation. Mild pain with empty can testing. Positive O'Brien's test. Neurovascular intact     ASSESSMENT & PLAN:   Glenoid labral tear, left, subsequent encounter Had some improvement with glenohumeral injection.  Seems that she is underlying labral tear from an injury while shoveling dirt or has some instability. -Counseled on home exercise therapy and supportive care. -MRI to evaluate for labral tear

## 2018-12-31 NOTE — Assessment & Plan Note (Signed)
Had some improvement with glenohumeral injection.  Seems that she is underlying labral tear from an injury while shoveling dirt or has some instability. -Counseled on home exercise therapy and supportive care. -MRI to evaluate for labral tear

## 2019-01-28 ENCOUNTER — Other Ambulatory Visit: Payer: Self-pay

## 2019-01-28 ENCOUNTER — Telehealth: Payer: Self-pay | Admitting: Family Medicine

## 2019-01-28 NOTE — Telephone Encounter (Signed)
Patient called stating the pain in her shoulder is getting worse and is starting to travel to her neck and down her arm. Patient also states she experienced sudden weakness while driving the other day.   Her MRI is scheduled for 8/9. She is asking for advice on what to do in the meantime

## 2019-02-01 ENCOUNTER — Telehealth: Payer: Self-pay | Admitting: Family Medicine

## 2019-02-01 ENCOUNTER — Ambulatory Visit: Payer: Self-pay | Admitting: Family Medicine

## 2019-02-01 NOTE — Telephone Encounter (Signed)
Spoke with patient about her symptoms.   Rosemarie Ax, MD [facility] 02/01/2019, 11:20 AM

## 2019-02-01 NOTE — Telephone Encounter (Signed)
Left VM for patient. If she calls back please have her speak with a nurse/CMA and inform that we could do a Toradol injection, try a muscle relaxer or gabapentin.   If any questions then please take the best time and phone number to call and I will try to call her back.   Rosemarie Ax, MD [facility] 02/01/2019, 8:30 AM

## 2019-02-02 ENCOUNTER — Ambulatory Visit (HOSPITAL_BASED_OUTPATIENT_CLINIC_OR_DEPARTMENT_OTHER)
Admission: RE | Admit: 2019-02-02 | Discharge: 2019-02-02 | Disposition: A | Discharge status: Home / Self Care | Payer: Self-pay | Source: Ambulatory Visit | Attending: Family Medicine | Admitting: Family Medicine

## 2019-02-02 ENCOUNTER — Encounter: Payer: Self-pay | Admitting: Family Medicine

## 2019-02-02 ENCOUNTER — Other Ambulatory Visit: Payer: Self-pay

## 2019-02-02 ENCOUNTER — Ambulatory Visit (INDEPENDENT_AMBULATORY_CARE_PROVIDER_SITE_OTHER): Payer: Self-pay | Admitting: Family Medicine

## 2019-02-02 VITALS — BP 139/86 | HR 73 | Ht 67.0 in | Wt 152.0 lb

## 2019-02-02 DIAGNOSIS — S43432D Superior glenoid labrum lesion of left shoulder, subsequent encounter: Secondary | ICD-10-CM

## 2019-02-02 DIAGNOSIS — M5412 Radiculopathy, cervical region: Secondary | ICD-10-CM | POA: Insufficient documentation

## 2019-02-02 MED ORDER — CYCLOBENZAPRINE HCL 10 MG PO TABS: 10.0000 mg | ORAL_TABLET | Freq: Three times a day (TID) | ORAL | 0 refills | Status: AC | PRN

## 2019-02-02 MED ORDER — GABAPENTIN 100 MG PO CAPS: 100.0000 mg | ORAL_CAPSULE | Freq: Three times a day (TID) | ORAL | 0 refills | Status: AC

## 2019-02-02 NOTE — Progress Notes (Signed)
Regina Thomas - 48 y.o. female MRN [id number]  Date of birth: July 23, 1970  SUBJECTIVE:  Including CC & ROS.  Chief Complaint  Patient presents with  . Follow-up    follow up for left shoulder    Regina Thomas is a 48 y.o. female that is presenting with ongoing left shoulder pain.  She is also experiencing left arm pain as well as some trapezius pain.  This seems to be in relation to the ongoing left shoulder pain.  She has a MRI of the left shoulder scheduled on 8/9.  She has been out of work due to the injury sustained.  She has been using compression.  She is received a intra-articular injection of the glenohumeral joint previously.  Has not had any improvement with modalities of this ongoing pain.  Unclear if this is related to her shoulder or another process.  Denies any injuries in the interim.   Review of Systems  Constitutional: Negative for fever.  HENT: Negative for congestion.   Respiratory: Negative for cough.   Cardiovascular: Negative for chest pain.  Gastrointestinal: Negative for abdominal pain.  Musculoskeletal: Negative for back pain.  Skin: Negative for color change.  Neurological: Negative for weakness.  Hematological: Negative for adenopathy.    HISTORY: Past Medical, Surgical, Social, and Family History Reviewed & Updated per EMR.   Pertinent Historical Findings include:  Past Medical History:  Diagnosis Date  . Hiatal hernia   . Stroke ([place])   . TIA (transient ischemic attack)     Past Surgical History:  Procedure Laterality Date  . APPENDECTOMY  1973  . [place]  . INNER EAR SURGERY     tubes   . [place]  . TONSILLECTOMY  1976    Allergies  Allergen Reactions  . Codeine Nausea And Vomiting  . Hydrocodone Nausea And Vomiting  . Penicillins   . Percocet [Oxycodone-Acetaminophen] Nausea And Vomiting  . Zithromax [Azithromycin] Palpitations    zpack    Family History  Problem Relation Age of Onset  . Hypertension  Mother   . Diabetes Mother   . Hypertension Father   . Diabetes Father   . Cancer Father        melanoma  . Cancer Maternal Grandmother        lung  . Heart attack Maternal Grandfather   . Stroke Maternal Grandfather      Social History   Socioeconomic History  . Marital status: Legally Separated    Spouse name: Not on file  . Number of children: 2  . Years of education: 96  . 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 Financial resource strain: Not on file  . Food insecurity    Worry: Not on file    Inability: Not on file  . Transportation needs    Medical: Not on file    Non-medical: Not on file  Tobacco Use  . Smoking status: Current Every Day Smoker    Packs/day: 0.50    Years: 15.00    Pack years: 7.50  . Smokeless tobacco: Never Used  Substance and Sexual Activity  . Alcohol use: No  . Drug use: No  . Sexual activity: Yes    Birth control/protection: None  Lifestyle  . Physical activity    Days per week: Not on file    Minutes per session: Not on file  . Stress: Not on file  Relationships  . Social Musicianconnections    Talks on phone: Not on file    Gets together: Not on file    Attends religious service: Not on file    Active member of club or organization: Not on file    Attends meetings of clubs or organizations: Not on file    Relationship status: Not on file  . Intimate partner violence    Fear of current or ex partner: Not on file    Emotionally abused: Not on file    Physically abused: Not on file    Forced sexual activity: Not on file  Other Topics Concern  . Not on file  Social History Narrative   Lives at home with dgtr, son-in-law, babies   Caffeine use- coffee, 1 cup daily; tea, 4 glasses daily     PHYSICAL EXAM:  VS: BP 139/86   Pulse 73   Ht 5\' 7"  (1.702 m)   Wt 152 lb (68.9 kg)   LMP 01/12/2019   BMI 23.81 kg/m  Physical Exam Gen: NAD, alert, cooperative with exam,  well-appearing ENT: normal lips, normal nasal mucosa,  Eye: normal EOM, normal conjunctiva and lids CV:  no edema, +2 pedal pulses   Resp: no accessory muscle use, non-labored,   Skin: no rashes, no areas of induration  Neuro: normal tone, normal sensation to touch Psych:  normal insight, alert and oriented MSK:  Neck/left shoulder: Normal range of motion of the shoulder. Limited active flexion and abduction secondary to pain. Normal strength resistance with shrug. Tenderness to palpation of the left trapezius. Pain with empty can testing. Positive O'Brien's test. Neurovascular intact     ASSESSMENT & PLAN:   Cervical radiculopathy The pain today seems more consistent with radiculopathy as well as spasm around the trapezius.  This could be secondary to the ongoing problems she has with her shoulder or if this is just an evolution of the previous pain that she was experiencing. -X-ray -Gabapentin. -Flexeril. -Counseled on supportive care  Glenoid labral tear, left, subsequent encounter Pain is been ongoing.  Waiting on MRI to be completed. -Provided work note

## 2019-02-02 NOTE — Assessment & Plan Note (Addendum)
Pain is been ongoing.  Waiting on MRI to be completed. -Provided work note

## 2019-02-02 NOTE — Assessment & Plan Note (Addendum)
The pain today seems more consistent with radiculopathy as well as spasm around the trapezius.  This could be secondary to the ongoing problems she has with her shoulder or if this is just an evolution of the previous pain that she was experiencing. -X-ray -Gabapentin. -Flexeril. -Counseled on supportive care

## 2019-02-02 NOTE — Patient Instructions (Signed)
Good to see you The gabapentin can make you sleepy.  Start with 1 at night and increase to 2 or 3 times daily. The muscle relaxer can make you sleepy as well.  Please take this and understand how you react to it before taking it with the gabapentin Please send me a message in MyChart with any questions or updates.  We will schedule a telephone visit once the MRI is completed..   --Dr. Raeford Razor

## 2019-02-03 ENCOUNTER — Telehealth: Payer: Self-pay | Admitting: Family Medicine

## 2019-02-03 NOTE — Telephone Encounter (Signed)
Left VM for patient. If she calls back please have her speak with a nurse/CMA and inform that her xray shows degenerative changes in different areas of the neck. This can be expected.   If any questions then please take the best time and phone number to call and I will try to call her back.   Rosemarie Ax, MD [facility] 02/03/2019, 1:10 PM

## 2019-02-09 ENCOUNTER — Emergency Department (HOSPITAL_BASED_OUTPATIENT_CLINIC_OR_DEPARTMENT_OTHER)
Admission: EM | Admit: 2019-02-09 | Discharge: 2019-02-09 | Disposition: A | Discharge status: Home / Self Care | Payer: Self-pay | Attending: Emergency Medicine | Admitting: Emergency Medicine

## 2019-02-09 ENCOUNTER — Encounter (HOSPITAL_BASED_OUTPATIENT_CLINIC_OR_DEPARTMENT_OTHER): Payer: Self-pay

## 2019-02-09 ENCOUNTER — Other Ambulatory Visit: Payer: Self-pay

## 2019-02-09 DIAGNOSIS — Z8673 Personal history of transient ischemic attack (TIA), and cerebral infarction without residual deficits: Secondary | ICD-10-CM | POA: Insufficient documentation

## 2019-02-09 DIAGNOSIS — X503XXA Overexertion from repetitive movements, initial encounter: Secondary | ICD-10-CM | POA: Insufficient documentation

## 2019-02-09 DIAGNOSIS — Y93H1 Activity, digging, shoveling and raking: Secondary | ICD-10-CM | POA: Insufficient documentation

## 2019-02-09 DIAGNOSIS — Y999 Unspecified external cause status: Secondary | ICD-10-CM | POA: Insufficient documentation

## 2019-02-09 DIAGNOSIS — Y929 Unspecified place or not applicable: Secondary | ICD-10-CM | POA: Insufficient documentation

## 2019-02-09 DIAGNOSIS — Z7982 Long term (current) use of aspirin: Secondary | ICD-10-CM | POA: Insufficient documentation

## 2019-02-09 DIAGNOSIS — S46912A Strain of unspecified muscle, fascia and tendon at shoulder and upper arm level, left arm, initial encounter: Secondary | ICD-10-CM | POA: Insufficient documentation

## 2019-02-09 DIAGNOSIS — F17200 Nicotine dependence, unspecified, uncomplicated: Secondary | ICD-10-CM | POA: Insufficient documentation

## 2019-02-09 NOTE — ED Provider Notes (Signed)
[facility] Provider Note   CSN: [id number] Arrival date & time: 02/09/19  1911     History   Chief Complaint Chief Complaint  Patient presents with  . Shoulder Pain    HPI Regina Thomas is a 48 y.o. female.     The history is provided by the patient and medical records. No language interpreter was used.  Shoulder Pain  Regina Thomas is a 48 y.o. female who presents to the Emergency Department complaining of shoulder pain. She presents to the emergency department for evaluation of acute on chronic left shoulder pain occurring after a work injury. She is scheduled for an MRI on August 9. Her symptoms began when she was digging a ditch when she felt a pop in her shoulder. She has been evaluated with sports medicine. She experiences ongoing pain in her anterior and posterior shoulder that is worse with abduction. Today with minimal movement of her shoulder she felt another pop in the front of her shoulder and now feels like she is swollen in the posterior shoulder region. She has similar pain to previous that is slightly worse. She is right-hand dominant. She denies any fevers, chest pain, shortness of breath, nausea, vomiting. She works as a Development worker, community. Past Medical History:  Diagnosis Date  . Hiatal hernia   . Stroke ([place])   . TIA (transient ischemic attack)     Patient Active Problem List   Diagnosis Date Noted  . Cervical radiculopathy 02/02/2019  . Glenoid labral tear, left, subsequent encounter 12/03/2018    Past Surgical History:  Procedure Laterality Date  . APPENDECTOMY  1973  . [place]  . INNER EAR SURGERY     tubes   . [place]  . TONSILLECTOMY  1976     OB History   No obstetric history on file.      Home Medications    Prior to Admission medications   Medication Sig Start Date End Date Taking? Authorizing Provider  aspirin 81 MG tablet Take 81 mg by mouth daily.    [provider]   chlorhexidine (PERIDEX) 0.12 % solution Use as directed 15 mLs in the mouth or throat 2 (two) times daily. Swish and spit.  Do not swallow. 08/26/18   Langston Masker B, PA-C  cyclobenzaprine (FLEXERIL) 10 MG tablet Take 1 tablet (10 mg total) by mouth 3 (three) times daily as needed. 02/02/19   Rosemarie Ax, MD  esomeprazole (NEXIUM) 10 MG packet Take 10 mg by mouth daily before breakfast.    [provider]  gabapentin (NEURONTIN) 100 MG capsule Take 1 capsule (100 mg total) by mouth 3 (three) times daily. 02/02/19   Rosemarie Ax, MD  lidocaine (XYLOCAINE) 2 % solution Use as directed 15 mLs in the mouth or throat as needed for mouth pain. Swish and spit.  Do not swallow. 08/26/18   Langston Masker B, PA-C  naproxen (NAPROSYN) 500 MG tablet Take 1 tablet (500 mg total) by mouth 2 (two) times daily as needed. 1/74/08   Delora Fuel, MD  triamcinolone cream (KENALOG) 0.1 % Apply 1 application topically 2 (two) times daily. 01/27/18   Jeannett Senior, PA-C    Family History Family History  Problem Relation Age of Onset  . Hypertension Mother   . Diabetes Mother   . Hypertension Father   . Diabetes Father   . Cancer Father        melanoma  . Cancer Maternal  Grandmother        lung  . Heart attack Maternal Grandfather   . Stroke Maternal Grandfather     Social History Social History   Tobacco Use  . Smoking status: Current Every Day Smoker    Packs/day: 0.50    Years: 15.00    Pack years: 7.50  . Smokeless tobacco: Never Used  Substance Use Topics  . Alcohol use: No  . Drug use: No     Allergies   Codeine, Hydrocodone, Penicillins, Percocet [oxycodone-acetaminophen], and Zithromax [azithromycin]   Review of Systems Review of Systems  All other systems reviewed and are negative.    Physical Exam Updated Vital Signs BP (!) 150/97 (BP Location: Left Arm)   Pulse 70   Temp 98.9 F (37.2 C) (Oral)   Resp 20   Ht 5\' 7"  (1.702 m)   Wt 64.4 kg   LMP  01/26/2019   SpO2 100%   BMI 22.24 kg/m   Physical Exam Vitals signs and nursing note reviewed.  Constitutional:      Appearance: She is well-developed.  HENT:     Head: Normocephalic and atraumatic.  Cardiovascular:     Rate and Rhythm: Normal rate and regular rhythm.  Pulmonary:     Effort: Pulmonary effort is normal. No respiratory distress.  Musculoskeletal:     Comments: 2+ radial pulses bilaterally. There is tenderness to palpation over the left clavicle, left trapezius and left lateral shoulder. She is unable to abductor past 90. She is able to cross midline. Full strength and in range of motion intact and the elbow, wrist, hand. There is no significant erythema or edema to the shoulder. There is no distal edema to the LUE  Skin:    General: Skin is warm and dry.  Neurological:     Mental Status: She is alert and oriented to person, place, and time.  Psychiatric:        Behavior: Behavior normal.      ED Treatments / Results  Labs (all labs ordered are listed, but only abnormal results are displayed) Labs Reviewed - No data to display  EKG None  Radiology No results found.  Procedures Procedures (including critical care time)  Medications Ordered in ED Medications - No data to display   Initial Impression / Assessment and Plan / ED Course  I have reviewed the triage vital signs and the nursing notes.  Pertinent labs & imaging results that were available during my care of the patient were reviewed by me and considered in my medical decision making (see chart for details).        Patient here for evaluation of acute on chronic left shoulder pain after feeling a pop. She is neurologically intact on examination with decreased range of motion in the shoulder. Presentation is not consistent with acute infectious process, fracture, dislocation, DVT. Discussed with patient home care for shoulder strain. Discussed importance of sports medicine follow-up as well as  continuing with her MRI is scheduled. Discussed continuing her home medications and return precautions.  Final Clinical Impressions(s) / ED Diagnoses   Final diagnoses:  Strain of left shoulder, initial encounter    ED Discharge Orders    None       Tilden Fossaees, Yailyn Strack, MD 02/09/19 2029

## 2019-02-09 NOTE — ED Triage Notes (Signed)
Pt c/o pain/swelling to left shoulder from previus injury-states she is being seen by ortho with MRI pending-appt 8/9-NAD-steady gait

## 2019-02-14 ENCOUNTER — Ambulatory Visit
Admission: RE | Admit: 2019-02-14 | Discharge: 2019-02-14 | Disposition: A | Discharge status: Home / Self Care | Payer: Self-pay | Source: Ambulatory Visit | Attending: Family Medicine | Admitting: Family Medicine

## 2019-02-14 ENCOUNTER — Other Ambulatory Visit: Payer: Self-pay

## 2019-02-14 DIAGNOSIS — S43432D Superior glenoid labrum lesion of left shoulder, subsequent encounter: Secondary | ICD-10-CM

## 2019-02-15 ENCOUNTER — Other Ambulatory Visit: Payer: Self-pay

## 2019-02-15 ENCOUNTER — Ambulatory Visit (INDEPENDENT_AMBULATORY_CARE_PROVIDER_SITE_OTHER): Payer: Self-pay | Admitting: Family Medicine

## 2019-02-15 DIAGNOSIS — M65812 Other synovitis and tenosynovitis, left shoulder: Secondary | ICD-10-CM

## 2019-02-15 MED ORDER — PREDNISONE 5 MG PO TABS: ORAL_TABLET | ORAL | 0 refills | Status: DC

## 2019-02-15 NOTE — Patient Instructions (Signed)
Good to see you Please continue ice  Please try the prednisone  Physical therapy will give you a call to schedule.  Please send me a message in MyChart with any questions or updates.  Please see me back in 6 weeks.   --Dr. Raeford Razor

## 2019-02-15 NOTE — Progress Notes (Addendum)
Regina Thomas - 48 y.o. female MRN [id number]  Date of birth: [birth year]  SUBJECTIVE:  Including CC & ROS.  No chief complaint on file.   Regina Thomas is a 48 y.o. female that is following up for her MRI results.  She has been released from her job.  The injury sustained with her shoulder occurred while she was working.  Has received a glenohumeral injection with minor improvement.  Independent review of the left MRI shoulder from 8/9 shows tendinopathy of the rotator cuff with synovitis.    Review of Systems  Constitutional: Negative for fever.  HENT: Negative for congestion.   Respiratory: Negative for cough.   Cardiovascular: Negative for chest pain.  Gastrointestinal: Negative for abdominal pain.  Musculoskeletal: Negative for back pain.  Neurological: Negative for weakness.  Hematological: Negative for adenopathy.    HISTORY: Past Medical, Surgical, Social, and Family History Reviewed & Updated per EMR.   Pertinent Historical Findings include:  Past Medical History:  Diagnosis Date  . Hiatal hernia   . Stroke ([place])   . TIA (transient ischemic attack)     Past Surgical History:  Procedure Laterality Date  . APPENDECTOMY  1973  . [place]  . INNER EAR SURGERY     tubes   . [place]  . TONSILLECTOMY  1976    Allergies  Allergen Reactions  . Codeine Nausea And Vomiting  . Hydrocodone Nausea And Vomiting  . Penicillins   . Percocet [Oxycodone-Acetaminophen] Nausea And Vomiting  . Zithromax [Azithromycin] Palpitations    zpack    Family History  Problem Relation Age of Onset  . Hypertension Mother   . Diabetes Mother   . Hypertension Father   . Diabetes Father   . Cancer Father        melanoma  . Cancer Maternal Grandmother        lung  . Heart attack Maternal Grandfather   . Stroke Maternal Grandfather      Social History   Socioeconomic History  . Marital status: Legally Separated    Spouse name: Not on file  .  Number of children: 2  . Years of education: 7  . 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 Financial resource strain: Not on file  . Food insecurity    Worry: Not on file    Inability: Not on file  . Transportation needs    Medical: Not on file    Non-medical: Not on file  Tobacco Use  . Smoking status: Current Every Day Smoker    Packs/day: 0.50    Years: 15.00    Pack years: 7.50  . Smokeless tobacco: Never Used  Substance and Sexual Activity  . Alcohol use: No  . Drug use: No  . Sexual activity: Not on file  Lifestyle  . Physical activity    Days per week: Not on file    Minutes per session: Not on file  . Stress: Not on file  Relationships  . Social Herbalist on phone: Not on file    Gets together: Not on file    Attends religious service: Not on file    Active member of club or organization: Not on file    Attends meetings of clubs or organizations: Not on file    Relationship status: Not on file  . Intimate partner violence    Fear of current  or ex partner: Not on file    Emotionally abused: Not on file    Physically abused: Not on file    Forced sexual activity: Not on file  Other Topics Concern  . Not on file  Social History Narrative   Lives at home with dgtr, son-in-law, babies   Caffeine use- coffee, 1 cup daily; tea, 4 glasses daily     PHYSICAL EXAM:  VS: LMP 01/26/2019  Physical Exam Gen: NAD, alert, cooperative with exam, well-appearing ENT: normal lips, normal nasal mucosa,  Eye: normal EOM, normal conjunctiva and lids CV:  no edema, +2 pedal pulses   Resp: no accessory muscle use, non-labored,  Skin: no rashes, no areas of induration  Neuro: normal tone, normal sensation to touch Psych:  normal insight, alert and oriented MSK:  Left shoulder:  Normal abduction and flexion  Normal ER  Normal strength to resistance with internal and external rotation. Normal grip  strength. No signs of atrophy. 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  Unlikely to be specifically tendinopathy.  She does have synovitis of the capsule but has good range of motion and external rotation.  Received previous glenohumeral injection. -Try prednisone. -If no improvement will consider subacromial injection. -Referral to physical therapy.

## 2019-02-16 NOTE — Assessment & Plan Note (Signed)
MRI was demonstrating differing pathologies.  Unlikely to be specifically tendinopathy.  She does have synovitis of the capsule but has good range of motion and external rotation.  Received previous glenohumeral injection. -Try prednisone. -If no improvement will consider subacromial injection. -Referral to physical therapy.

## 2019-02-24 ENCOUNTER — Ambulatory Visit: Payer: No Typology Code available for payment source | Attending: Family Medicine | Admitting: Physical Therapy

## 2019-03-03 ENCOUNTER — Ambulatory Visit: Payer: No Typology Code available for payment source | Admitting: Physical Therapy

## 2019-03-13 ENCOUNTER — Encounter (HOSPITAL_BASED_OUTPATIENT_CLINIC_OR_DEPARTMENT_OTHER): Payer: Self-pay | Admitting: *Deleted

## 2019-03-13 ENCOUNTER — Emergency Department (HOSPITAL_BASED_OUTPATIENT_CLINIC_OR_DEPARTMENT_OTHER)
Admission: EM | Admit: 2019-03-13 | Discharge: 2019-03-13 | Disposition: A | Discharge status: Home / Self Care | Payer: Self-pay | Attending: Emergency Medicine | Admitting: Emergency Medicine

## 2019-03-13 ENCOUNTER — Other Ambulatory Visit: Payer: Self-pay

## 2019-03-13 DIAGNOSIS — Z7982 Long term (current) use of aspirin: Secondary | ICD-10-CM | POA: Insufficient documentation

## 2019-03-13 DIAGNOSIS — F17210 Nicotine dependence, cigarettes, uncomplicated: Secondary | ICD-10-CM | POA: Insufficient documentation

## 2019-03-13 DIAGNOSIS — N12 Tubulo-interstitial nephritis, not specified as acute or chronic: Secondary | ICD-10-CM | POA: Insufficient documentation

## 2019-03-13 DIAGNOSIS — R109 Unspecified abdominal pain: Secondary | ICD-10-CM | POA: Insufficient documentation

## 2019-03-13 DIAGNOSIS — Z79899 Other long term (current) drug therapy: Secondary | ICD-10-CM | POA: Insufficient documentation

## 2019-03-13 LAB — URINALYSIS, ROUTINE W REFLEX MICROSCOPIC
Bilirubin Urine: NEGATIVE
Glucose, UA: NEGATIVE mg/dL
Ketones, ur: NEGATIVE mg/dL
Nitrite: NEGATIVE
Protein, ur: NEGATIVE mg/dL
Specific Gravity, Urine: <1.005 — ABNORMAL LOW (ref 1.005–1.030)
pH: 7 (ref 5.0–8.0)

## 2019-03-13 LAB — PREGNANCY, URINE: Preg Test, Ur: NEGATIVE

## 2019-03-13 LAB — URINALYSIS, MICROSCOPIC (REFLEX)

## 2019-03-13 MED ORDER — CEPHALEXIN 500 MG PO CAPS: 1000.0000 mg | ORAL_CAPSULE | Freq: Two times a day (BID) | ORAL | 0 refills | Status: AC

## 2019-03-13 MED ORDER — TRAMADOL HCL 50 MG PO TABS: 100.0000 mg | ORAL_TABLET | Freq: Four times a day (QID) | ORAL | 0 refills | Status: DC | PRN

## 2019-03-13 NOTE — Discharge Instructions (Signed)
1.  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  You have had prior CT scans showing some enlargement of that kidney and history of a childhood surgery.  It is important that you follow-up with the urologist for ongoing monitoring and diagnostic studies regarding any problems with possible narrowing or compression of the ureter on that side. 2.  Start taking Keflex today as prescribed.  You may take tramadol 1 to 2 tablets every 6 hours for pain if needed.  You may also take extra strength Tylenol every 6-8 hours with the tramadol for pain control. 3.  Return to the emergency department immediately if your pain is worsening, you develop a fever, you become weak nauseated or otherwise feel that you are getting worse.

## 2019-03-13 NOTE — ED Provider Notes (Signed)
[facility] EMERGENCY DEPARTMENT Provider Note   CSN: [id number] Arrival date & time: 03/13/19  1507     History   Chief Complaint Chief Complaint  Patient presents with  . Flank Pain    HPI Regina Thomas is a 48 y.o. female.     HPI Patient reports about 4 days ago she started noticing pain in her right flank when she urinated.  She then started developing urinary frequency.  She reports now she is very tender in the right flank and having frequent urination.  She reports the urine however does not smell foul or look cloudy.  No fever no chills no nausea no vomiting.  She reports she has had kidney infection in the past with similar symptoms.  She denies any prior history of kidney stones.  No diarrhea review of systems is otherwise negative. Past Medical History:  Diagnosis Date  . Hiatal hernia   . Stroke (HCC)   . TIA (transient ischemic attack)     Patient Active Problem List   Diagnosis Date Noted  . Cervical radiculopathy 02/02/2019  . Synovitis of left shoulder 12/03/2018    Past Surgical History:  Procedure Laterality Date  . APPENDECTOMY  1973  . CESAREAN SECTION  1993, 1997  . INNER EAR SURGERY     tubes   . KIDNEY SURGERY  1973  . TONSILLECTOMY  1976     OB History   No obstetric history on file.      Home Medications    Prior to Admission medications   Medication Sig Start Date End Date Taking? Authorizing Provider  aspirin 81 MG tablet Take 81 mg by mouth daily.   Yes [provider]  esomeprazole (NEXIUM) 10 MG packet Take 10 mg by mouth daily before breakfast.   Yes [provider]  vitamin C (ASCORBIC ACID) 500 MG tablet Take 500 mg by mouth daily.   Yes [provider]  chlorhexidine (PERIDEX) 0.12 % solution Use as directed 15 mLs in the mouth or throat 2 (two) times daily. Swish and spit.  Do not swallow. 08/26/18   Aviva Kluver B, PA-C  cyclobenzaprine (FLEXERIL) 10 MG tablet Take 1 tablet (10 mg  total) by mouth 3 (three) times daily as needed. 02/02/19   Myra Rude, MD  ferrous sulfate 325 (65 FE) MG tablet Take by mouth.    [provider]  gabapentin (NEURONTIN) 100 MG capsule Take 1 capsule (100 mg total) by mouth 3 (three) times daily. 02/02/19   Myra Rude, MD  lidocaine (XYLOCAINE) 2 % solution Use as directed 15 mLs in the mouth or throat as needed for mouth pain. Swish and spit.  Do not swallow. 08/26/18   Aviva Kluver B, PA-C  naproxen (NAPROSYN) 500 MG tablet Take 1 tablet (500 mg total) by mouth 2 (two) times daily as needed. 11/25/18   Dione Booze, MD  predniSONE (DELTASONE) 5 MG tablet Take 6 pills for first day, 5 pills second day, 4 pills third day, 3 pills fourth day, 2 pills the fifth day, and 1 pill sixth day. 02/15/19   Myra Rude, MD  triamcinolone cream (KENALOG) 0.1 % Apply 1 application topically 2 (two) times daily. 01/27/18   Jaynie Crumble, PA-C    Family History Family History  Problem Relation Age of Onset  . Hypertension Mother   . Diabetes Mother   . Hypertension Father   . Diabetes Father   . Cancer Father  melanoma  . Cancer Maternal Grandmother        lung  . Heart attack Maternal Grandfather   . Stroke Maternal Grandfather     Social History Social History   Tobacco Use  . Smoking status: Current Every Day Smoker    Packs/day: 0.50    Years: 15.00    Pack years: 7.50    Types: Cigarettes  . Smokeless tobacco: Never Used  Substance Use Topics  . Alcohol use: No  . Drug use: No     Allergies   Codeine, Hydrocodone, Penicillins, Percocet [oxycodone-acetaminophen], and Zithromax [azithromycin]   Review of Systems Review of Systems 10 Systems reviewed and are negative for acute change except as noted in the HPI.   Physical Exam Updated Vital Signs BP (!) 146/88 (BP Location: Left Arm)   Pulse 68   Temp 98.8 F (37.1 C) (Oral)   Resp 18   Ht 5\' 7"  (1.702 m)   Wt 68 kg   LMP 02/13/2019    SpO2 99%   BMI 23.49 kg/m   Physical Exam Constitutional:      Appearance: Normal appearance.  Eyes:     Extraocular Movements: Extraocular movements intact.  Cardiovascular:     Rate and Rhythm: Normal rate and regular rhythm.  Pulmonary:     Effort: Pulmonary effort is normal.     Breath sounds: Normal breath sounds.  Abdominal:     Palpations: Abdomen is soft.     Comments: Mild to moderate suprapubic and right lateral quadrant tenderness to palpation.  No guarding.  Positive right CVA tenderness.  Musculoskeletal: Normal range of motion.        General: No swelling or tenderness.     Right lower leg: No edema.     Left lower leg: No edema.  Skin:    General: Skin is warm and dry.  Neurological:     General: No focal deficit present.     Mental Status: She is alert and oriented to person, place, and time.     Coordination: Coordination normal.  Psychiatric:        Mood and Affect: Mood normal.      ED Treatments / Results  Labs (all labs ordered are listed, but only abnormal results are displayed) Labs Reviewed  URINALYSIS, ROUTINE W REFLEX MICROSCOPIC  PREGNANCY, URINE    EKG None  Radiology No results found.  Procedures Procedures (including critical care time)  Medications Ordered in ED Medications - No data to display   Initial Impression / Assessment and Plan / ED Course  I have reviewed the triage vital signs and the nursing notes.  Pertinent labs & imaging results that were available during my care of the patient were reviewed by me and considered in my medical decision making (see chart for details).       Patient had symptoms of right flank pain with urination and frequency.  No other associated symptoms.  She reports she otherwise feels well without fever, nausea, vomiting, chills or myalgia.  Patient reports she has had similar episodes with a kidney infection in the past.  She had a surgical procedure as a very young child but cannot  recall what it was regarding that kidney.  She reports her mother told her it was "closed off like a straw pinched".  I have reviewed EMR for prior CT scans which have shown some persistent right hydronephrosis.  Patient has no history of stones.  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  She reports she did see a urologist once a number of years ago but nothing additional was done besides a follow-up appointment.  At this time, patient would prefer to start her antibiotics and schedule follow-up rather than pursue repeat CT scan at this time.  Last CT scan was January 2019.  Patient is counseled on necessity to return immediately should she develop fever, increasing pain, constitutional symptoms or any concerns for worsening of her condition.  Final Clinical Impressions(s) / ED Diagnoses   Final diagnoses:  Right flank pain  Pyelonephritis    ED Discharge Orders    None       Arby BarrettePfeiffer, Lamees Gable, MD 03/13/19 1606

## 2019-03-13 NOTE — ED Triage Notes (Signed)
Pt reports urine frequency and burning and right flank pain x 4 days

## 2019-03-13 NOTE — ED Notes (Signed)
ED Provider at bedside. 

## 2019-03-14 LAB — URINE CULTURE: Culture: 60000 — AB

## 2019-03-29 ENCOUNTER — Ambulatory Visit: Payer: Self-pay | Admitting: Family Medicine

## 2019-03-29 NOTE — Progress Notes (Deleted)
Regina Thomas - 48 y.o. female MRN [id number]  Date of birth: 1970/12/11  SUBJECTIVE:  Including CC & ROS.  No chief complaint on file.   Regina Thomas is a 48 y.o. female that is  ***.  ***   Review of Systems  HISTORY: Past Medical, Surgical, Social, and Family History Reviewed & Updated per EMR.   Pertinent Historical Findings include:  Past Medical History:  Diagnosis Date  . Hiatal hernia   . Stroke ([place])   . TIA (transient ischemic attack)     Past Surgical History:  Procedure Laterality Date  . APPENDECTOMY  1973  . [place]  . INNER EAR SURGERY     tubes   . [place]  . TONSILLECTOMY  1976    Allergies  Allergen Reactions  . Codeine Nausea And Vomiting  . Hydrocodone Nausea And Vomiting  . Penicillins   . Percocet [Oxycodone-Acetaminophen] Nausea And Vomiting  . Zithromax [Azithromycin] Palpitations    zpack    Family History  Problem Relation Age of Onset  . Hypertension Mother   . Diabetes Mother   . Hypertension Father   . Diabetes Father   . Cancer Father        melanoma  . Cancer Maternal Grandmother        lung  . Heart attack Maternal Grandfather   . Stroke Maternal Grandfather      Social History   Socioeconomic History  . Marital status: Legally Separated    Spouse name: Not on file  . Number of children: 2  . Years of education: 15  . 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 Financial resource strain: Not on file  . Food insecurity    Worry: Not on file    Inability: Not on file  . Transportation needs    Medical: Not on file    Non-medical: Not on file  Tobacco Use  . Smoking status: Current Every Day Smoker    Packs/day: 0.50    Years: 15.00    Pack years: 7.50    Types: Cigarettes  . Smokeless tobacco: Never Used  Substance and Sexual Activity  . Alcohol use: No  . Drug use: No  . Sexual activity: Not on file  Lifestyle   . Physical activity    Days per week: Not on file    Minutes per session: Not on file  . Stress: Not on file  Relationships  . Social Herbalist on phone: Not on file    Gets together: Not on file    Attends religious service: Not on file    Active member of club or organization: Not on file    Attends meetings of clubs or organizations: Not on file    Relationship status: Not on file  . Intimate partner violence    Fear of current or ex partner: Not on file    Emotionally abused: Not on file    Physically abused: Not on file    Forced sexual activity: Not on file  Other Topics Concern  . Not on file  Social History Narrative   Lives at home with dgtr, son-in-law, babies   Caffeine use- coffee, 1 cup daily; tea, 4 glasses daily     PHYSICAL EXAM:  VS: There were no vitals taken for this visit. Physical Exam Gen: NAD, alert, cooperative with exam, well-appearing ENT: normal  lips, normal nasal mucosa,  Eye: normal EOM, normal conjunctiva and lids CV:  no edema, +2 pedal pulses   Resp: no accessory muscle use, non-labored,  GI: no masses or tenderness, no hernia  Skin: no rashes, no areas of induration  Neuro: normal tone, normal sensation to touch Psych:  normal insight, alert and oriented MSK:  ***      ASSESSMENT & PLAN:   No problem-specific Assessment & Plan notes found for this encounter.

## 2019-11-07 ENCOUNTER — Encounter (HOSPITAL_BASED_OUTPATIENT_CLINIC_OR_DEPARTMENT_OTHER): Payer: Self-pay | Admitting: *Deleted

## 2019-11-07 ENCOUNTER — Emergency Department (HOSPITAL_BASED_OUTPATIENT_CLINIC_OR_DEPARTMENT_OTHER)
Admission: EM | Admit: 2019-11-07 | Discharge: 2019-11-07 | Disposition: A | Discharge status: Home / Self Care | Payer: Self-pay | Attending: Emergency Medicine | Admitting: Emergency Medicine

## 2019-11-07 ENCOUNTER — Telehealth (HOSPITAL_BASED_OUTPATIENT_CLINIC_OR_DEPARTMENT_OTHER): Payer: Self-pay | Admitting: Physician Assistant

## 2019-11-07 ENCOUNTER — Other Ambulatory Visit: Payer: Self-pay

## 2019-11-07 DIAGNOSIS — K0889 Other specified disorders of teeth and supporting structures: Secondary | ICD-10-CM | POA: Insufficient documentation

## 2019-11-07 DIAGNOSIS — F17210 Nicotine dependence, cigarettes, uncomplicated: Secondary | ICD-10-CM | POA: Insufficient documentation

## 2019-11-07 DIAGNOSIS — Z7982 Long term (current) use of aspirin: Secondary | ICD-10-CM | POA: Insufficient documentation

## 2019-11-07 DIAGNOSIS — Z79899 Other long term (current) drug therapy: Secondary | ICD-10-CM | POA: Insufficient documentation

## 2019-11-07 DIAGNOSIS — K029 Dental caries, unspecified: Secondary | ICD-10-CM | POA: Insufficient documentation

## 2019-11-07 MED ORDER — KETOROLAC TROMETHAMINE 15 MG/ML IJ SOLN
15.0000 mg | Freq: Once | INTRAMUSCULAR | Status: AC
  Administered 2019-11-07: 15 mg via INTRAMUSCULAR

## 2019-11-07 MED ORDER — DICLOFENAC SODIUM 75 MG PO TBEC: 75.0000 mg | DELAYED_RELEASE_TABLET | Freq: Two times a day (BID) | ORAL | 0 refills | Status: AC

## 2019-11-07 MED ORDER — KETOROLAC TROMETHAMINE 30 MG/ML IJ SOLN
14.0000 mg | Freq: Once | INTRAMUSCULAR | Status: DC
  Filled 2019-11-07: qty 1

## 2019-11-07 MED ORDER — METRONIDAZOLE 500 MG PO TABS: 500.0000 mg | ORAL_TABLET | Freq: Two times a day (BID) | ORAL | 0 refills | Status: AC

## 2019-11-07 MED ORDER — CEFUROXIME AXETIL 500 MG PO TABS: 500.0000 mg | ORAL_TABLET | Freq: Two times a day (BID) | ORAL | 0 refills | Status: AC

## 2019-11-07 MED ORDER — CLINDAMYCIN HCL 300 MG PO CAPS: 300.0000 mg | ORAL_CAPSULE | Freq: Four times a day (QID) | ORAL | 0 refills | Status: AC

## 2019-11-07 MED ORDER — DICLOFENAC SODIUM 25 MG PO TBEC: 25.0000 mg | DELAYED_RELEASE_TABLET | Freq: Two times a day (BID) | ORAL | 0 refills | Status: DC

## 2019-11-07 NOTE — Telephone Encounter (Cosign Needed)
Secretary received phone call from patient stating that her pharmacy was out of the prescription for Voltaren that was prescribed to her earlier today.  She also states that the clindamycin is too expensive.  She request that her medications be sent to the [facility] on [address] in [facility] [place].  We will discontinue the clindamycin and instead elected for a treatment of Cefpodoxime and Flagyl for her dental infection.  She has tolerated cephalosporins in the past.

## 2019-11-07 NOTE — ED Triage Notes (Signed)
Pt reports 4 broken teeth on right side of mouth x 4 days. States she has a dentist appointment later this month. Taking aleve with minimal relief

## 2019-11-07 NOTE — ED Provider Notes (Signed)
[facility] EMERGENCY DEPARTMENT Provider Note   CSN: [id number] Arrival date & time: 11/07/19  1607     History Chief Complaint  Patient presents with  . Dental Pain    Regina Thomas is a 49 y.o. female.  49 year old female presents with complaint of dental pain.  Patient reports severe decay to all of her teeth however having pain to the right lower jaw at this time.  No trauma, no fever, no drainage.  Patient is taking ibuprofen without improvement.  Patient is scheduled to see a dentist in 2 weeks.        Past Medical History:  Diagnosis Date  . Hiatal hernia   . Stroke (HCC)   . TIA (transient ischemic attack)     Patient Active Problem List   Diagnosis Date Noted  . Cervical radiculopathy 02/02/2019  . Synovitis of left shoulder 12/03/2018    Past Surgical History:  Procedure Laterality Date  . APPENDECTOMY  1973  . CESAREAN SECTION  1993, 1997  . INNER EAR SURGERY     tubes   . KIDNEY SURGERY  1973  . TONSILLECTOMY  1976     OB History   No obstetric history on file.     Family History  Problem Relation Age of Onset  . Hypertension Mother   . Diabetes Mother   . Hypertension Father   . Diabetes Father   . Cancer Father        melanoma  . Cancer Maternal Grandmother        lung  . Heart attack Maternal Grandfather   . Stroke Maternal Grandfather     Social History   Tobacco Use  . Smoking status: Current Every Day Smoker    Packs/day: 0.50    Years: 15.00    Pack years: 7.50    Types: Cigarettes  . Smokeless tobacco: Never Used  Substance Use Topics  . Alcohol use: No  . Drug use: No    Home Medications Prior to Admission medications   Medication Sig Start Date End Date Taking? Authorizing Provider  aspirin 81 MG tablet Take 81 mg by mouth daily.    [provider]  cephALEXin (KEFLEX) 500 MG capsule Take 2 capsules (1,000 mg total) by mouth 2 (two) times daily. 03/13/19   Arby Barrette, MD  chlorhexidine  (PERIDEX) 0.12 % solution Use as directed 15 mLs in the mouth or throat 2 (two) times daily. Swish and spit.  Do not swallow. 08/26/18   Aviva Kluver B, PA-C  clindamycin (CLEOCIN) 300 MG capsule Take 1 capsule (300 mg total) by mouth every 6 (six) hours for 7 days. 11/07/19 11/14/19  Jeannie Fend, PA-C  cyclobenzaprine (FLEXERIL) 10 MG tablet Take 1 tablet (10 mg total) by mouth 3 (three) times daily as needed. 02/02/19   Myra Rude, MD  diclofenac (VOLTAREN) 25 MG EC tablet Take 1 tablet (25 mg total) by mouth 2 (two) times daily for 10 days. 11/07/19 11/17/19  Jeannie Fend, PA-C  esomeprazole (NEXIUM) 10 MG packet Take 10 mg by mouth daily before breakfast.    [provider]  ferrous sulfate 325 (65 FE) MG tablet Take by mouth.    [provider]  gabapentin (NEURONTIN) 100 MG capsule Take 1 capsule (100 mg total) by mouth 3 (three) times daily. 02/02/19   Myra Rude, MD  lidocaine (XYLOCAINE) 2 % solution Use as directed 15 mLs in the mouth or throat as needed for mouth pain.  Swish and spit.  Do not swallow. 08/26/18   Aviva Kluver B, PA-C  naproxen (NAPROSYN) 500 MG tablet Take 1 tablet (500 mg total) by mouth 2 (two) times daily as needed. 11/25/18   Dione Booze, MD  predniSONE (DELTASONE) 5 MG tablet Take 6 pills for first day, 5 pills second day, 4 pills third day, 3 pills fourth day, 2 pills the fifth day, and 1 pill sixth day. 02/15/19   Myra Rude, MD  traMADol (ULTRAM) 50 MG tablet Take 2 tablets (100 mg total) by mouth every 6 (six) hours as needed. 03/13/19   Arby Barrette, MD  triamcinolone cream (KENALOG) 0.1 % Apply 1 application topically 2 (two) times daily. 01/27/18   Kirichenko, Lemont Fillers, PA-C  vitamin C (ASCORBIC ACID) 500 MG tablet Take 500 mg by mouth daily.    [provider]    Allergies    Codeine, Hydrocodone, Penicillins, Percocet [oxycodone-acetaminophen], and Zithromax [azithromycin]  Review of Systems   Review of Systems   Constitutional: Negative for chills and fever.  HENT: Positive for dental problem. Negative for facial swelling, trouble swallowing and voice change.   Gastrointestinal: Negative for vomiting.  Musculoskeletal: Negative for neck pain and neck stiffness.  Skin: Negative for rash and wound.  Allergic/Immunologic: Negative for immunocompromised state.  Neurological: Negative for headaches.  Hematological: Negative for adenopathy.  Psychiatric/Behavioral: Negative for confusion.  All other systems reviewed and are negative.   Physical Exam Updated Vital Signs BP (!) 159/89 (BP Location: Right Arm)   Pulse 79   Temp 99.6 F (37.6 C) (Oral)   Resp 16   Ht 5\' 7"  (1.702 m)   Wt 65.8 kg   LMP 10/24/2019   SpO2 99%   BMI 22.71 kg/m   Physical Exam Vitals and nursing note reviewed.  Constitutional:      General: She is not in acute distress.    Appearance: She is well-developed. She is not diaphoretic.  HENT:     Head: Normocephalic and atraumatic.     Jaw: No trismus.     Comments: Severe decay to all teeth, no obvious abscess. Pulmonary:     Effort: Pulmonary effort is normal.  Neurological:     Mental Status: She is alert and oriented to person, place, and time.  Psychiatric:        Behavior: Behavior normal.     ED Results / Procedures / Treatments   Labs (all labs ordered are listed, but only abnormal results are displayed) Labs Reviewed - No data to display  EKG None  Radiology No results found.  Procedures Procedures (including critical care time)  Medications Ordered in ED Medications  ketorolac (TORADOL) 30 MG/ML injection 14 mg (has no administration in time range)    ED Course  I have reviewed the triage vital signs and the nursing notes.  Pertinent labs & imaging results that were available during my care of the patient were reviewed by me and considered in my medical decision making (see chart for details).  Clinical Course as of Nov 07 1643   Sun Nov 07, 2019  4065 49 year old female with complaint of dental pain.  On exam, no trismus, has significant decay to all of her teeth, no obvious abscess.  Patient be even prescription of clindamycin, and diclofenac for pain and given dental resource list.   [LM]    Clinical Course User Index [LM] 44   MDM Rules/Calculators/A&P  Final Clinical Impression(s) / ED Diagnoses Final diagnoses:  Pain, dental    Rx / DC Orders ED Discharge Orders         Ordered    diclofenac (VOLTAREN) 25 MG EC tablet  2 times daily     11/07/19 1642    clindamycin (CLEOCIN) 300 MG capsule  Every 6 hours     11/07/19 1642           Tacy Learn, PA-C 11/07/19 1645    Veryl Speak, MD 11/07/19 2302

## 2019-11-07 NOTE — Discharge Instructions (Addendum)
Rinse with Listerine after every meal. Take clindamycin as prescribed and complete the full course. Take diclofenac as needed as prescribed for pain. Follow-up with a dentist as soon as possible.

## 2019-11-09 ENCOUNTER — Other Ambulatory Visit: Payer: Self-pay

## 2019-11-09 ENCOUNTER — Encounter (HOSPITAL_BASED_OUTPATIENT_CLINIC_OR_DEPARTMENT_OTHER): Payer: Self-pay | Admitting: *Deleted

## 2019-11-09 ENCOUNTER — Emergency Department (HOSPITAL_BASED_OUTPATIENT_CLINIC_OR_DEPARTMENT_OTHER)
Admission: EM | Admit: 2019-11-09 | Discharge: 2019-11-09 | Disposition: A | Discharge status: Home / Self Care | Payer: Self-pay | Attending: Emergency Medicine | Admitting: Emergency Medicine

## 2019-11-09 DIAGNOSIS — Z8673 Personal history of transient ischemic attack (TIA), and cerebral infarction without residual deficits: Secondary | ICD-10-CM | POA: Insufficient documentation

## 2019-11-09 DIAGNOSIS — K047 Periapical abscess without sinus: Secondary | ICD-10-CM | POA: Insufficient documentation

## 2019-11-09 DIAGNOSIS — F17210 Nicotine dependence, cigarettes, uncomplicated: Secondary | ICD-10-CM | POA: Insufficient documentation

## 2019-11-09 DIAGNOSIS — Z79899 Other long term (current) drug therapy: Secondary | ICD-10-CM | POA: Insufficient documentation

## 2019-11-09 DIAGNOSIS — Z7982 Long term (current) use of aspirin: Secondary | ICD-10-CM | POA: Insufficient documentation

## 2019-11-09 MED ORDER — CHLORHEXIDINE GLUCONATE 0.12 % MT SOLN: 15.0000 mL | Freq: Two times a day (BID) | OROMUCOSAL | 0 refills | Status: AC

## 2019-11-09 MED ORDER — CHLORHEXIDINE GLUCONATE 0.12% ORAL RINSE (MEDLINE KIT): 15.0000 mL | Freq: Two times a day (BID) | OROMUCOSAL | 0 refills | Status: DC

## 2019-11-09 NOTE — ED Triage Notes (Signed)
Dental abscess with swelling to the right side of her face.

## 2019-11-09 NOTE — ED Provider Notes (Signed)
[facility] Provider Note   CSN: [id number] Arrival date & time: 11/09/19  1424     History Chief Complaint  Patient presents with  . Dental Pain  . Facial Swelling    Regina Thomas is a 49 y.o. female.  HPI 49 year old female with a history of TIA, stroke presents to the ER with dental pain.  Patient was seen on 11/07/2019 and was discharged with clindamycin and diclofenac for pain with close follow-up with PCP.  She then called back and stated the clindamycin was too expensive, Cefpodoxime and Flagyl were called instead.  Patient states that she started the clindamycin yesterday, and applied a heating pad to her right side of the face.  After some time she noticed severe swelling to the right lower side.  She states the swelling has remained constant, she is is not having trouble swallowing, swelling under her tongue, difficulty breathing breathing, drooling, fevers, chills.  She returns to the ER as she was told that if she had swelling to the right side of her face that she needed to come back.  She states her dental pain remained fairly unchanged from her baseline.  She has a follow-up with a dentist in 2 weeks.    Past Medical History:  Diagnosis Date  . Hiatal hernia   . Stroke ([place])   . TIA (transient ischemic attack)     Patient Active Problem List   Diagnosis Date Noted  . Cervical radiculopathy 02/02/2019  . Synovitis of left shoulder 12/03/2018    Past Surgical History:  Procedure Laterality Date  . APPENDECTOMY  1973  . [place]  . INNER EAR SURGERY     tubes   . [place]  . TONSILLECTOMY  1976     OB History   No obstetric history on file.     Family History  Problem Relation Age of Onset  . Hypertension Mother   . Diabetes Mother   . Hypertension Father   . Diabetes Father   . Cancer Father        melanoma  . Cancer Maternal Grandmother        lung  . Heart attack Maternal Grandfather    . Stroke Maternal Grandfather     Social History   Tobacco Use  . Smoking status: Current Every Day Smoker    Packs/day: 0.50    Years: 15.00    Pack years: 7.50    Types: Cigarettes  . Smokeless tobacco: Never Used  Substance Use Topics  . Alcohol use: No  . Drug use: No    Home Medications Prior to Admission medications   Medication Sig Start Date End Date Taking? Authorizing Provider  aspirin 81 MG tablet Take 81 mg by mouth daily.   Yes [provider]  esomeprazole (NEXIUM) 10 MG packet Take 10 mg by mouth daily before breakfast.   Yes [provider]  ferrous sulfate 325 (65 FE) MG tablet Take by mouth.   Yes [provider]  vitamin C (ASCORBIC ACID) 500 MG tablet Take 500 mg by mouth daily.   Yes [provider]  cefUROXime (CEFTIN) 500 MG tablet Take 1 tablet (500 mg total) by mouth 2 (two) times daily with a meal for 7 days. 11/07/19 11/14/19  Rodell Perna A, PA-C  cephALEXin (KEFLEX) 500 MG capsule Take 2 capsules (1,000 mg total) by mouth 2 (two) times daily. 03/13/19   Charlesetta Shanks, MD  chlorhexidine (Fallis)  0.12 % solution Use as directed 15 mLs in the mouth or throat 2 (two) times daily. 11/09/19   Garald Balding, PA-C  clindamycin (CLEOCIN) 300 MG capsule Take 1 capsule (300 mg total) by mouth every 6 (six) hours for 7 days. 11/07/19 11/14/19  Tacy Learn, PA-C  cyclobenzaprine (FLEXERIL) 10 MG tablet Take 1 tablet (10 mg total) by mouth 3 (three) times daily as needed. 02/02/19   Rosemarie Ax, MD  diclofenac (VOLTAREN) 75 MG EC tablet Take 1 tablet (75 mg total) by mouth 2 (two) times daily for 7 days. 11/07/19 11/14/19  Rodell Perna A, PA-C  gabapentin (NEURONTIN) 100 MG capsule Take 1 capsule (100 mg total) by mouth 3 (three) times daily. 02/02/19   Rosemarie Ax, MD  lidocaine (XYLOCAINE) 2 % solution Use as directed 15 mLs in the mouth or throat as needed for mouth pain. Swish and spit.  Do not swallow. 08/26/18   Langston Masker  B, PA-C  metroNIDAZOLE (FLAGYL) 500 MG tablet Take 1 tablet (500 mg total) by mouth 2 (two) times daily. 11/07/19   Fawze, Mina A, PA-C  naproxen (NAPROSYN) 500 MG tablet Take 1 tablet (500 mg total) by mouth 2 (two) times daily as needed. 5/67/01   Delora Fuel, MD  predniSONE (DELTASONE) 5 MG tablet Take 6 pills for first day, 5 pills second day, 4 pills third day, 3 pills fourth day, 2 pills the fifth day, and 1 pill sixth day. 02/15/19   Rosemarie Ax, MD  traMADol (ULTRAM) 50 MG tablet Take 2 tablets (100 mg total) by mouth every 6 (six) hours as needed. 03/13/19   Charlesetta Shanks, MD  triamcinolone cream (KENALOG) 0.1 % Apply 1 application topically 2 (two) times daily. 01/27/18   Kirichenko, Lahoma Rocker, PA-C    Allergies    Codeine, Hydrocodone, Penicillins, Percocet [oxycodone-acetaminophen], and Zithromax [azithromycin]  Review of Systems   Review of Systems  Constitutional: Negative for activity change, appetite change, chills and fever.  HENT: Positive for dental problem and facial swelling. Negative for drooling, ear pain, mouth sores, sinus pressure, sinus pain, sore throat, trouble swallowing and voice change.   Eyes: Negative for photophobia, pain, discharge, redness and visual disturbance.  Respiratory: Negative for cough and shortness of breath.   Cardiovascular: Negative for chest pain and palpitations.  Gastrointestinal: Negative for abdominal pain, nausea and vomiting.  Musculoskeletal: Negative for back pain, neck pain and neck stiffness.  Skin: Negative for color change and rash.  Allergic/Immunologic: Negative for immunocompromised state.  Neurological: Negative for dizziness, seizures, syncope, weakness, numbness and headaches.  Hematological: Negative for adenopathy.  Psychiatric/Behavioral: Negative for confusion.  All other systems reviewed and are negative.   Physical Exam Updated Vital Signs BP 131/89   Pulse 65   Temp 98.9 F (37.2 C) (Oral)   Resp 18   Ht  _0  (1.702 m)   Wt 65.8 kg   LMP 10/24/2019   SpO2 100%   BMI 22.72 kg/m   Physical Exam Vitals and nursing note reviewed.  Constitutional:      General: She is not in acute distress.    Appearance: Normal appearance. She is well-developed and normal weight. She is not ill-appearing, toxic-appearing or diaphoretic.  HENT:     Head: Normocephalic and atraumatic.     Right Ear: Tympanic membrane and external ear normal.     Left Ear: Tympanic membrane and external ear normal.     Nose: Nose normal.  Mouth/Throat:     Mouth: Mucous membranes are moist.     Pharynx: Oropharynx is clear. No oropharyngeal exudate or posterior oropharyngeal erythema.     Comments: Notable swelling to right lower jaw.  Questionable fluctuance.  No obvious pus drainage.  Tenderness to palpation over inner and outer cheek.  No erythema, rashes.  Overall poor dentition, significant decay to all teeth.  No evidence of gum swelling, sublingual/submandibular/parotid swelling, uvula midline, no evidence of abscess, tonsils without exudate.  Normal tongue.  Eyes:     Extraocular Movements: Extraocular movements intact.     Conjunctiva/sclera: Conjunctivae normal.     Pupils: Pupils are equal, round, and reactive to light.  Cardiovascular:     Rate and Rhythm: Normal rate and regular rhythm.     Heart sounds: No murmur.  Pulmonary:     Effort: Pulmonary effort is normal. No respiratory distress.     Breath sounds: Normal breath sounds.  Abdominal:     Palpations: Abdomen is soft.     Tenderness: There is no abdominal tenderness.  Musculoskeletal:        General: No swelling. Normal range of motion.     Cervical back: Normal range of motion and neck supple. No rigidity or tenderness.  Skin:    General: Skin is warm and dry.     Findings: No bruising or erythema.  Neurological:     General: No focal deficit present.     Mental Status: She is alert and oriented to person, place, and time.  Psychiatric:         Mood and Affect: Mood normal.        Behavior: Behavior normal.     ED Results / Procedures / Treatments   Labs (all labs ordered are listed, but only abnormal results are displayed) Labs Reviewed - No data to display  EKG None  Radiology No results found.  Procedures Procedures (including critical care time)  Medications Ordered in ED Medications - No data to display  ED Course  I have reviewed the triage vital signs and the nursing notes.  Pertinent labs & imaging results that were available during my care of the patient were reviewed by me and considered in my medical decision making (see chart for details).    MDM Rules/Calculators/A&P                     49 year old female with dental pain and swelling to right cheek.  Patient does have some notable swelling to right lower cheek but she has no trismus, no obvious drainage, questionable fluctuance with no indication for drainage in the ER.  She is afebrile, with no red flags for sepsis or osteomyelitis. She is not immunocompromised and denies IVDU.  No evidence of Ludwig's, peritonsillar abscess, retropharyngeal abscess.  No significant erythema over abscess.  She is overall nontoxic-appearing, with no tripoding, no drooling, vitals reassuring.  I do not think additional lab work or imaging is indicated at this time.  Encouraged patient to continue taking Cefpodoxime and Flagyl as prescribed, naproxen for pain relief.  I also prescribed her some Peridex mouthwash.  Stressed importance of follow-up with dentist, she has a scheduled appointment with one in several weeks.  Patient is reassured by the work-up and voices understanding.  Very strict return precautions given.  At this stage the patient has been adequately medically screened and is stable for discharge.  Patient was seen and evaluated by Dr. Billy Fischer and she is agreeable to  the above plan.  Final Clinical Impression(s) / ED Diagnoses Final diagnoses:  Dental  abscess    Rx / DC Orders ED Discharge Orders         Ordered    chlorhexidine (PERIDEX) 0.12 % solution  2 times daily     11/09/19 1607    chlorhexidine gluconate, MEDLINE KIT, (PERIDEX) 0.12 % solution  2 times daily,   Status:  Discontinued     11/09/19 1608           Garald Balding, PA-C 11/09/19 2243    Gareth Morgan, MD 11/10/19 1423

## 2019-11-09 NOTE — ED Notes (Signed)
ED Provider at bedside. 

## 2019-11-09 NOTE — Discharge Instructions (Addendum)
Please continue taking antibiotic as prescribed.  It is important that you keep your dentist appointment.  Make sure to gargle saline water, or prescribed.  Ask mouthwash.  Return to the ER if your symptoms worsen or you develop fevers.

## 2020-02-08 ENCOUNTER — Encounter (HOSPITAL_BASED_OUTPATIENT_CLINIC_OR_DEPARTMENT_OTHER): Payer: Self-pay

## 2020-02-08 ENCOUNTER — Other Ambulatory Visit: Payer: Self-pay

## 2020-02-08 ENCOUNTER — Emergency Department (HOSPITAL_BASED_OUTPATIENT_CLINIC_OR_DEPARTMENT_OTHER)
Admission: EM | Admit: 2020-02-08 | Discharge: 2020-02-08 | Disposition: A | Discharge status: Home / Self Care | Payer: Self-pay | Attending: Emergency Medicine | Admitting: Emergency Medicine

## 2020-02-08 DIAGNOSIS — Z5321 Procedure and treatment not carried out due to patient leaving prior to being seen by health care provider: Secondary | ICD-10-CM | POA: Insufficient documentation

## 2020-02-08 DIAGNOSIS — M25561 Pain in right knee: Secondary | ICD-10-CM | POA: Insufficient documentation

## 2020-02-08 NOTE — ED Notes (Signed)
Due to no direct injury and that her ortho advised her she "may just need a different brace" pt is agreeable to wait to see EDP prior to any xray order

## 2020-02-08 NOTE — ED Triage Notes (Signed)
Pt c/o right knee pain x 4 days-denies direct injury-states she has been moving-states she was advised by her ortho to come to ED-pt has knee brace in place/states she has been wearing x 2 days-NAD-limping gait

## 2020-02-09 ENCOUNTER — Ambulatory Visit: Payer: Self-pay

## 2020-02-09 ENCOUNTER — Encounter: Payer: Self-pay | Admitting: Family Medicine

## 2020-02-09 ENCOUNTER — Ambulatory Visit (INDEPENDENT_AMBULATORY_CARE_PROVIDER_SITE_OTHER): Payer: Self-pay | Admitting: Family Medicine

## 2020-02-09 VITALS — BP 137/92 | HR 77 | Ht 67.0 in | Wt 145.0 lb

## 2020-02-09 DIAGNOSIS — M25561 Pain in right knee: Secondary | ICD-10-CM

## 2020-02-09 MED ORDER — PREDNISONE 5 MG PO TABS: ORAL_TABLET | ORAL | 0 refills | Status: AC

## 2020-02-09 NOTE — Progress Notes (Signed)
Regina Thomas - 49 y.o. female MRN [id number]  Date of birth: Oct 15, 1970  SUBJECTIVE:  Including CC & ROS.  Chief Complaint  Patient presents with  . Knee Pain    right x 2 weeks    Regina Thomas is a 49 y.o. female that is presenting with acute right knee pain.  The pain is occurring over the medial aspect and radiates proximally over the anterior aspect of the quad.  She has been caring several heavy boxes of the different flights of stairs.  The pain has been severe in nature.  No improvement with medications thus far.  Has tried a hinged knee brace.  Has a history of subluxation of the patella.  Pain is worse with any ambulation..    Review of Systems See HPI   HISTORY: Past Medical, Surgical, Social, and Family History Reviewed & Updated per EMR.   Pertinent Historical Findings include:  Past Medical History:  Diagnosis Date  . Hiatal hernia   . Stroke ([facility])   . TIA (transient ischemic attack)     Past Surgical History:  Procedure Laterality Date  . APPENDECTOMY  1973  . CESAREAN SECTION  1993, 1997  . INNER EAR SURGERY     tubes   . KIDNEY SURGERY  1973  . TONSILLECTOMY  1976    Family History  Problem Relation Age of Onset  . Hypertension Mother   . Diabetes Mother   . Hypertension Father   . Diabetes Father   . Cancer Father        melanoma  . Cancer Maternal Grandmother        lung  . Heart attack Maternal Grandfather   . Stroke Maternal Grandfather     Social History   Socioeconomic History  . Marital status: Legally Separated    Spouse name: Not on file  . Number of children: 2  . Years of education: 81  . 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 Smoking status: Current Every Day Smoker    Packs/day: 0.50    Years: 15.00    Pack years: 7.50    Types: Cigarettes  . Smokeless tobacco: Never Used  Vaping Use  . Vaping Use: Never used  Substance and Sexual Activity  . Alcohol use: No   . Drug use: No  . Sexual activity: Not on file  Other Topics Concern  . Not on file  Social History Narrative   Lives at home with dgtr, son-in-law, babies   Caffeine use- coffee, 1 cup daily; tea, 4 glasses daily   Social Determinants of Health   Financial Resource Strain:   . Difficulty of Paying Living Expenses:   Food Insecurity:   . Worried About Programme researcher, broadcasting/film/video in the Last Year:   . Barista in the Last Year:   Transportation Needs:   . Freight forwarder (Medical):   Marland Kitchen Lack of Transportation (Non-Medical):   Physical Activity:   . Days of Exercise per Week:   . Minutes of Exercise per Session:   Stress:   . Feeling of Stress :   Social Connections:   . Frequency of Communication with Friends and Family:   . Frequency of Social Gatherings with Friends and Family:   . Attends Religious Services:   . Active Member of Clubs or Organizations:   . Attends Banker Meetings:   Marland Kitchen Marital Status:   Intimate  Partner Violence:   . Fear of Current or Ex-Partner:   . Emotionally Abused:   Marland Kitchen Physically Abused:   . Sexually Abused:      PHYSICAL EXAM:  VS: BP (!) 137/92   Pulse 77   Ht 5\' 7"  (1.702 m)   Wt 145 lb (65.8 kg)   BMI 22.71 kg/m  Physical Exam Gen: NAD, alert, cooperative with exam, well-appearing MSK:  Right knee: No obvious effusion. Limited flexion. Normal extension. Tenderness to palpation over the medial femoral condyle and joint space. Neurovascularly intact  Limited ultrasound: Right knee:  No effusion within the suprapatellar pouch. Normal-appearing quadricep and patellar tendon. Normal-appearing medial joint space. Normal-appearing lateral joint space.  Summary: No abnormal findings as to the source of her pain.  Ultrasound and interpretation by , MD    ASSESSMENT & PLAN:   Acute pain of right knee The pain could either be related to a lateral femoral cutaneous nerve being irritated from  resting heavy objects on around her waist when she was lifting them up a flight of stairs or possibly a bony stress reaction to the severity of her pain.  No changes observed on ultrasound at the knee or the musculature. -Counseled on nonweightbearing and knee immobilizer. - Counseled on supportive care -Prednisone -Could consider further imaging

## 2020-02-09 NOTE — Patient Instructions (Signed)
Good to see you Please try to take the weight off of that knee  Please try the medicine   Please send me a message in MyChart with any questions or updates.  Please see me back in 2 weeks.   --Dr. Jordan Likes

## 2020-02-09 NOTE — Assessment & Plan Note (Signed)
The pain could either be related to a lateral femoral cutaneous nerve being irritated from resting heavy objects on around her waist when she was lifting them up a flight of stairs or possibly a bony stress reaction to the severity of her pain.  No changes observed on ultrasound at the knee or the musculature. -Counseled on nonweightbearing and knee immobilizer. - Counseled on supportive care -Prednisone -Could consider further imaging

## 2020-02-10 ENCOUNTER — Ambulatory Visit: Payer: Self-pay | Admitting: Family Medicine

## 2020-02-21 ENCOUNTER — Emergency Department (HOSPITAL_BASED_OUTPATIENT_CLINIC_OR_DEPARTMENT_OTHER)
Admission: EM | Admit: 2020-02-21 | Discharge: 2020-02-21 | Disposition: A | Discharge status: Home / Self Care | Payer: Self-pay | Attending: Emergency Medicine | Admitting: Emergency Medicine

## 2020-02-21 ENCOUNTER — Encounter (HOSPITAL_BASED_OUTPATIENT_CLINIC_OR_DEPARTMENT_OTHER): Payer: Self-pay

## 2020-02-21 ENCOUNTER — Other Ambulatory Visit: Payer: Self-pay

## 2020-02-21 ENCOUNTER — Emergency Department (HOSPITAL_BASED_OUTPATIENT_CLINIC_OR_DEPARTMENT_OTHER): Payer: Self-pay

## 2020-02-21 DIAGNOSIS — F17210 Nicotine dependence, cigarettes, uncomplicated: Secondary | ICD-10-CM | POA: Insufficient documentation

## 2020-02-21 DIAGNOSIS — Z79899 Other long term (current) drug therapy: Secondary | ICD-10-CM | POA: Insufficient documentation

## 2020-02-21 DIAGNOSIS — Z880 Allergy status to penicillin: Secondary | ICD-10-CM | POA: Insufficient documentation

## 2020-02-21 DIAGNOSIS — Z7982 Long term (current) use of aspirin: Secondary | ICD-10-CM | POA: Insufficient documentation

## 2020-02-21 DIAGNOSIS — G43109 Migraine with aura, not intractable, without status migrainosus: Secondary | ICD-10-CM | POA: Insufficient documentation

## 2020-02-21 LAB — COMPREHENSIVE METABOLIC PANEL
ALT: 14 U/L (ref 0–44)
AST: 21 U/L (ref 15–41)
Albumin: 4.1 g/dL (ref 3.5–5.0)
Alkaline Phosphatase: 78 U/L (ref 38–126)
Anion gap: 12 (ref 5–15)
BUN: 14 mg/dL (ref 6–20)
CO2: 24 mmol/L (ref 22–32)
Calcium: 9.3 mg/dL (ref 8.9–10.3)
Chloride: 104 mmol/L (ref 98–111)
Creatinine, Ser: 0.67 mg/dL (ref 0.44–1.00)
GFR calc Af Amer: >60 mL/min (ref >60)
GFR calc non Af Amer: >60 mL/min (ref >60)
Glucose, Bld: 94 mg/dL (ref 70–99)
Potassium: 3.9 mmol/L (ref 3.5–5.1)
Sodium: 140 mmol/L (ref 135–145)
Total Bilirubin: 0.5 mg/dL (ref 0.3–1.2)
Total Protein: 7.3 g/dL (ref 6.5–8.1)

## 2020-02-21 LAB — DIFFERENTIAL
Abs Immature Granulocytes: 0.02 10*3/uL (ref 0.00–0.07)
Basophils Absolute: 0.1 10*3/uL (ref 0.0–0.1)
Basophils Relative: 1 %
Eosinophils Absolute: 0.2 10*3/uL (ref 0.0–0.5)
Eosinophils Relative: 2 %
Immature Granulocytes: 0 %
Lymphocytes Relative: 36 %
Lymphs Abs: 2.5 10*3/uL (ref 0.7–4.0)
Monocytes Absolute: 0.6 10*3/uL (ref 0.1–1.0)
Monocytes Relative: 8 %
Neutro Abs: 3.6 10*3/uL (ref 1.7–7.7)
Neutrophils Relative %: 53 %

## 2020-02-21 LAB — CBG MONITORING, ED: Glucose-Capillary: 90 mg/dL (ref 70–99)

## 2020-02-21 LAB — CBC
HCT: 43 % (ref 36.0–46.0)
Hemoglobin: 14 g/dL (ref 12.0–15.0)
MCH: 30.4 pg (ref 26.0–34.0)
MCHC: 32.6 g/dL (ref 30.0–36.0)
MCV: 93.3 fL (ref 80.0–100.0)
Platelets: 256 10*3/uL (ref 150–400)
RBC: 4.61 MIL/uL (ref 3.87–5.11)
RDW: 13.4 % (ref 11.5–15.5)
WBC: 6.9 10*3/uL (ref 4.0–10.5)
nRBC: 0 % (ref 0.0–0.2)

## 2020-02-21 LAB — PROTIME-INR
INR: 1 (ref 0.8–1.2)
Prothrombin Time: 12.4 seconds (ref 11.4–15.2)

## 2020-02-21 LAB — APTT: aPTT: 38 seconds — ABNORMAL HIGH (ref 24–36)

## 2020-02-21 MED ORDER — PROCHLORPERAZINE EDISYLATE 10 MG/2ML IJ SOLN
5.0000 mg | Freq: Once | INTRAMUSCULAR | Status: AC
  Administered 2020-02-21: 5 mg via INTRAVENOUS
  Filled 2020-02-21: qty 2

## 2020-02-21 MED ORDER — DIPHENHYDRAMINE HCL 50 MG/ML IJ SOLN
25.0000 mg | Freq: Once | INTRAMUSCULAR | Status: AC
  Administered 2020-02-21: 25 mg via INTRAVENOUS
  Filled 2020-02-21: qty 1

## 2020-02-21 MED ORDER — SODIUM CHLORIDE 0.9% FLUSH
3.0000 mL | Freq: Once | INTRAVENOUS | Status: DC
Start: 2020-02-21 — End: 2020-02-21
  Filled 2020-02-21: qty 3

## 2020-02-21 NOTE — Discharge Instructions (Addendum)
You were seen today for headache and numbness of the face.  Your CT scan is negative.  Your work-up is largely reassuring.  This may have been a complicated migraine.  Follow-up closely with your primary physician.

## 2020-02-21 NOTE — ED Triage Notes (Signed)
Pt c/o HA that started this AM and has waxed and wanned throughout the day. At 1700 pt started having lightheadedness that was noted upon standing. At 0000 pt noted decreased sensation on the L side of her face. Per pt her daughter told her that her face was drooping on the L side. No facial droop appreciated by this RN. No arm drift, or leg weakness noted in triage. EDP notified of pt's symptoms.

## 2020-02-21 NOTE — ED Provider Notes (Signed)
[facility] EMERGENCY DEPARTMENT Provider Note   CSN: [id number] Arrival date & time: 02/21/20  0031     History Chief Complaint  Patient presents with  . Headache    Regina Thomas is a 49 y.o. female.  HPI     This 49 year old female with a history of hiatal hernia, TIA, smoking who presents with headache and numbness of the face.  Patient reports onset of headache yesterday morning.  She states that it is bitemporal.  Gradually worsened slightly throughout the day.  Approximately 50 minutes prior to arrival she noticed decreased sensation on the left lower jaw.  She states that her daughter thought she had some facial droop.  She did not note any facial droop herself.  Denies any speech difficulty, vision changes, weakness, numbness, tingling anywhere else.  She currently rates her headache at 6 out of 10.  She does continue to smoke.  She is on a baby aspirin daily.  She reports a history of TIA in the past brought on by stress.  Denies any recent fevers or illnesses.  No neck pain.  Past Medical History:  Diagnosis Date  . Hiatal hernia   . Stroke (HCC)   . TIA (transient ischemic attack)     Patient Active Problem List   Diagnosis Date Noted  . Acute pain of right knee 02/09/2020  . Cervical radiculopathy 02/02/2019  . Synovitis of left shoulder 12/03/2018    Past Surgical History:  Procedure Laterality Date  . APPENDECTOMY  1973  . CESAREAN SECTION  1993, 1997  . INNER EAR SURGERY     tubes   . KIDNEY SURGERY  1973  . TONSILLECTOMY  1976     OB History   No obstetric history on file.     Family History  Problem Relation Age of Onset  . Hypertension Mother   . Diabetes Mother   . Hypertension Father   . Diabetes Father   . Cancer Father        melanoma  . Cancer Maternal Grandmother        lung  . Heart attack Maternal Grandfather   . Stroke Maternal Grandfather     Social History   Tobacco Use  . Smoking status: Current Every Day  Smoker    Packs/day: 0.50    Years: 15.00    Pack years: 7.50    Types: Cigarettes  . Smokeless tobacco: Never Used  Vaping Use  . Vaping Use: Never used  Substance Use Topics  . Alcohol use: No  . Drug use: No    Home Medications Prior to Admission medications   Medication Sig Start Date End Date Taking? Authorizing Provider  aspirin 81 MG tablet Take 81 mg by mouth daily.    [provider]  cephALEXin (KEFLEX) 500 MG capsule Take 2 capsules (1,000 mg total) by mouth 2 (two) times daily. 03/13/19   Arby Barrette, MD  chlorhexidine (PERIDEX) 0.12 % solution Use as directed 15 mLs in the mouth or throat 2 (two) times daily. 11/09/19   Mare Ferrari, PA-C  cyclobenzaprine (FLEXERIL) 10 MG tablet Take 1 tablet (10 mg total) by mouth 3 (three) times daily as needed. 02/02/19   Myra Rude, MD  esomeprazole (NEXIUM) 10 MG packet Take 10 mg by mouth daily before breakfast.    [provider]  ferrous sulfate 325 (65 FE) MG tablet Take by mouth.    [provider]  gabapentin (NEURONTIN) 100 MG capsule Take 1  capsule (100 mg total) by mouth 3 (three) times daily. 02/02/19   Myra Rude, MD  lidocaine (XYLOCAINE) 2 % solution Use as directed 15 mLs in the mouth or throat as needed for mouth pain. Swish and spit.  Do not swallow. 08/26/18   Aviva Kluver B, PA-C  metroNIDAZOLE (FLAGYL) 500 MG tablet Take 1 tablet (500 mg total) by mouth 2 (two) times daily. 11/07/19   Fawze, Mina A, PA-C  naproxen (NAPROSYN) 500 MG tablet Take 1 tablet (500 mg total) by mouth 2 (two) times daily as needed. 11/25/18   Dione Booze, MD  predniSONE (DELTASONE) 5 MG tablet Take 6 pills for first day, 5 pills second day, 4 pills third day, 3 pills fourth day, 2 pills the fifth day, and 1 pill sixth day. 02/09/20   Myra Rude, MD  traMADol (ULTRAM) 50 MG tablet Take 2 tablets (100 mg total) by mouth every 6 (six) hours as needed. 03/13/19   Arby Barrette, MD  triamcinolone cream  (KENALOG) 0.1 % Apply 1 application topically 2 (two) times daily. 01/27/18   Kirichenko, Lemont Fillers, PA-C  vitamin C (ASCORBIC ACID) 500 MG tablet Take 500 mg by mouth daily.    [provider]    Allergies    Codeine, Hydrocodone, Penicillins, Percocet [oxycodone-acetaminophen], and Zithromax [azithromycin]  Review of Systems   Review of Systems  Constitutional: Negative for fever.  Respiratory: Negative for shortness of breath.   Cardiovascular: Negative for chest pain.  Gastrointestinal: Negative for abdominal pain, nausea and vomiting.  Neurological: Positive for light-headedness, numbness and headaches. Negative for dizziness.  All other systems reviewed and are negative.   Physical Exam Updated Vital Signs BP (!) 162/110   Pulse 75   Temp 98.8 F (37.1 C) (Oral)   Resp 15   Ht 1.702 m (5\' 7" )   Wt 62.6 kg   SpO2 100%   BMI 21.62 kg/m   Physical Exam Vitals and nursing note reviewed.  Constitutional:      Appearance: She is well-developed. She is not ill-appearing.     Comments: ABCs intact  HENT:     Head: Normocephalic and atraumatic.     Mouth/Throat:     Mouth: Mucous membranes are moist.  Eyes:     Pupils: Pupils are equal, round, and reactive to light.  Cardiovascular:     Rate and Rhythm: Normal rate and regular rhythm.     Heart sounds: Normal heart sounds.  Pulmonary:     Effort: Pulmonary effort is normal. No respiratory distress.     Breath sounds: No wheezing.  Abdominal:     General: Bowel sounds are normal.     Palpations: Abdomen is soft.  Musculoskeletal:     Cervical back: Normal range of motion and neck supple.  Skin:    General: Skin is warm and dry.  Neurological:     Mental Status: She is alert and oriented to person, place, and time.     Comments: Cranial nerves II through XII intact, 5 out of 5 strength in all 4 extremities, no dysmetria to finger-nose-finger, no drift, sensation intact objectively  Psychiatric:         Speech: Speech normal.     ED Results / Procedures / Treatments   Labs (all labs ordered are listed, but only abnormal results are displayed) Labs Reviewed  APTT - Abnormal; Notable for the following components:      Result Value   aPTT 38 (*)    All  other components within normal limits  PROTIME-INR  CBC  DIFFERENTIAL  COMPREHENSIVE METABOLIC PANEL  PREGNANCY, URINE  CBG MONITORING, ED    EKG EKG Interpretation  Date/Time:  Monday February 21 2020 00:57:20 EDT Ventricular Rate:  65 PR Interval:    QRS Duration: 92 QT Interval:  385 QTC Calculation: 401 R Axis:   40 Text Interpretation: Sinus rhythm Probable left atrial enlargement Confirmed by Ross Marcus ([id number]) on 02/21/2020 2:17:24 AM   Radiology CT HEAD WO CONTRAST  Result Date: 02/21/2020 CLINICAL DATA:  Headache decreased sensation left side of face EXAM: CT HEAD WITHOUT CONTRAST TECHNIQUE: Contiguous axial images were obtained from the base of the skull through the vertex without intravenous contrast. COMPARISON:  MRI 08/20/2015, CT brain 08/13/2015 FINDINGS: Brain: No evidence of acute infarction, hemorrhage, hydrocephalus, extra-axial collection or mass lesion/mass effect. Vascular: No hyperdense vessel or unexpected calcification. Skull: Normal. Negative for fracture or focal lesion. Sinuses/Orbits: No acute finding. Other: None. IMPRESSION: Negative non contrasted CT appearance of the brain. Electronically Signed   By: Jasmine Pang M.D.   On: 02/21/2020 01:08    Procedures Procedures (including critical care time)  Medications Ordered in ED Medications  sodium chloride flush (NS) 0.9 % injection 3 mL (3 mLs Intravenous Not Given 02/21/20 0148)  prochlorperazine (COMPAZINE) injection 5 mg (5 mg Intravenous Given 02/21/20 0138)  diphenhydrAMINE (BENADRYL) injection 25 mg (25 mg Intravenous Given 02/21/20 0138)    ED Course  I have reviewed the triage vital signs and the nursing notes.  Pertinent labs &  imaging results that were available during my care of the patient were reviewed by me and considered in my medical decision making (see chart for details).    MDM Rules/Calculators/A&P                           Patient presents with headache and left facial numbness.  Questionable facial droop by daughter.  She is overall nontoxic and vital signs are reassuring with the exception of a blood pressure of 162/110.  She does not take anything for her blood pressure.  She has a nonfocal neurologic exam.  Considerations include but not limited to, complex migraine, intracranial bleed although less likely, stroke.  Patient was given a migraine cocktail.  CT scan of the head obtained and shows no evidence of intracranial bleed.  Doubt subarachnoid hemorrhage.  No signs or symptoms of infection.  Doubt meningitis.  Prior to full evaluation, patient is requesting discharge.  She states she feels completely better and is without symptoms.  I have reviewed her lab work-up which is largely reassuring.  Discussed with her that I felt this was likely a complicated migraine.  Continue aspirin at home and follow-up closely with PCP.  After history, exam, and medical workup I feel the patient has been appropriately medically screened and is safe for discharge home. Pertinent diagnoses were discussed with the patient. Patient was given return precautions.   Final Clinical Impression(s) / ED Diagnoses Final diagnoses:  Complicated migraine    Rx / DC Orders ED Discharge Orders    None       Derico Mitton, Mayer Masker, MD 02/21/20 0230

## 2020-02-21 NOTE — ED Notes (Signed)
Attempted IV x2 without success, second RN to attempt

## 2020-02-22 ENCOUNTER — Telehealth (HOSPITAL_COMMUNITY): Payer: Self-pay

## 2020-02-24 ENCOUNTER — Ambulatory Visit: Payer: Self-pay | Admitting: Family Medicine

## 2020-02-25 ENCOUNTER — Ambulatory Visit: Payer: Self-pay

## 2020-02-25 ENCOUNTER — Other Ambulatory Visit: Payer: Self-pay

## 2020-02-25 ENCOUNTER — Encounter: Payer: Self-pay | Admitting: Family Medicine

## 2020-02-25 ENCOUNTER — Ambulatory Visit (INDEPENDENT_AMBULATORY_CARE_PROVIDER_SITE_OTHER): Payer: Self-pay | Admitting: Family Medicine

## 2020-02-25 ENCOUNTER — Ambulatory Visit: Payer: Self-pay | Admitting: Family Medicine

## 2020-02-25 VITALS — BP 145/84 | HR 67 | Ht 67.0 in | Wt 142.0 lb

## 2020-02-25 DIAGNOSIS — M25561 Pain in right knee: Secondary | ICD-10-CM

## 2020-02-25 NOTE — Patient Instructions (Signed)
Good to see you Please try to keep the weight off of the knee as you can  Please use the duexis as needed Please try ice   Please send me a message in MyChart with any questions or updates.  Please see me back in 4 weeks.   --Dr. Jordan Likes

## 2020-02-25 NOTE — Progress Notes (Signed)
Regina Thomas - 49 y.o. female MRN [id number]  Date of birth: [birth year]  SUBJECTIVE:  Including CC & ROS.  Chief Complaint  Patient presents with  . Knee Pain    right     Regina Thomas is a 49 y.o. female that is presenting with acute worsening of her right knee pain.  She ended up hitting her right knee on the bumper of the van.  Since that time she is having pain over the patellar tendon and medial joint line.   Review of Systems See HPI   HISTORY: Past Medical, Surgical, Social, and Family History Reviewed & Updated per EMR.   Pertinent Historical Findings include:  Past Medical History:  Diagnosis Date  . Hiatal hernia   . Stroke (HCC)   . TIA (transient ischemic attack)     Past Surgical History:  Procedure Laterality Date  . APPENDECTOMY  1973  . CESAREAN SECTION  1993, 1997  . INNER EAR SURGERY     tubes   . KIDNEY SURGERY  1973  . TONSILLECTOMY  1976    Family History  Problem Relation Age of Onset  . Hypertension Mother   . Diabetes Mother   . Hypertension Father   . Diabetes Father   . Cancer Father        melanoma  . Cancer Maternal Grandmother        lung  . Heart attack Maternal Grandfather   . Stroke Maternal Grandfather     Social History   Socioeconomic History  . Marital status: Legally Separated    Spouse name: Not on file  . Number of children: 2  . Years of education: 74  . 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 Smoking status: Current Every Day Smoker    Packs/day: 0.50    Years: 15.00    Pack years: 7.50    Types: Cigarettes  . Smokeless tobacco: Never Used  Vaping Use  . Vaping Use: Never used  Substance and Sexual Activity  . Alcohol use: No  . Drug use: No  . Sexual activity: Not on file  Other Topics Concern  . Not on file  Social History Narrative   Lives at home with dgtr, son-in-law, babies   Caffeine use- coffee, 1 cup daily; tea, 4 glasses daily    Social Determinants of Health   Financial Resource Strain:   . Difficulty of Paying Living Expenses: Not on file  Food Insecurity:   . Worried About Programme researcher, broadcasting/film/video in the Last Year: Not on file  . Ran Out of Food in the Last Year: Not on file  Transportation Needs:   . Lack of Transportation (Medical): Not on file  . Lack of Transportation (Non-Medical): Not on file  Physical Activity:   . Days of Exercise per Week: Not on file  . Minutes of Exercise per Session: Not on file  Stress:   . Feeling of Stress : Not on file  Social Connections:   . Frequency of Communication with Friends and Family: Not on file  . Frequency of Social Gatherings with Friends and Family: Not on file  . Attends Religious Services: Not on file  . Active Member of Clubs or Organizations: Not on file  . Attends Banker Meetings: Not on file  . Marital Status: Not on file  Intimate Partner Violence:   . Fear of Current or Ex-Partner: Not on file  .  Emotionally Abused: Not on file  . Physically Abused: Not on file  . Sexually Abused: Not on file     PHYSICAL EXAM:  VS: BP (!) 145/84   Pulse 67   Ht 5\' 7"  (1.702 m)   Wt 142 lb (64.4 kg)   BMI 22.24 kg/m  Physical Exam Gen: NAD, alert, cooperative with exam, well-appearing MSK:  Right knee: Tenderness palpation over the patella tendon and medial joint line. Normal range of motion. No effusion. Normal strength resistance. Neurovascularly intact  Limited ultrasound: Right knee:  No effusion. Normal-appearing patellar tendon. No acute changes of the medial joint space.  Summary: No acute structural changes appreciated.  Ultrasound and interpretation by , MD    ASSESSMENT & PLAN:   Acute pain of right knee She hit her knee on the bumper of the van.  No structural changes appreciated ultrasound today.  She may have a bone bruise as to why she is having such pain.  This is different from her previous knee  pain she was seen for earlier.  That seems to have improved. -Counseled on home exercise therapy and supportive care. -Counseled on partial weightbearing. -Provided Duexis samples. -Could consider further imaging or physical therapy if needed.

## 2020-02-25 NOTE — Progress Notes (Signed)
Medication Samples have been provided to the patient.  Drug name: Duexis       Strength: 800mg /26.67m        Qty: 2 boxes  LOT: 11m  Exp.Date: 10/2020  Dosing instructions: take 1 tablet by mouth three (3) times a day.  The patient has been instructed regarding the correct time, dose, and frequency of taking this medication, including desired effects and most common side effects.   11/2020, Kathi Simpers 12:03 PM 02/25/2020

## 2020-02-25 NOTE — Assessment & Plan Note (Signed)
She hit her knee on the bumper of the van.  No structural changes appreciated ultrasound today.  She may have a bone bruise as to why she is having such pain.  This is different from her previous knee pain she was seen for earlier.  That seems to have improved. -Counseled on home exercise therapy and supportive care. -Counseled on partial weightbearing. -Provided Duexis samples. -Could consider further imaging or physical therapy if needed.

## 2020-03-06 ENCOUNTER — Telehealth: Payer: Self-pay | Admitting: Family Medicine

## 2020-03-06 ENCOUNTER — Other Ambulatory Visit: Payer: Self-pay

## 2020-03-06 ENCOUNTER — Ambulatory Visit (HOSPITAL_BASED_OUTPATIENT_CLINIC_OR_DEPARTMENT_OTHER)
Admission: RE | Admit: 2020-03-06 | Discharge: 2020-03-06 | Disposition: A | Discharge status: Home / Self Care | Payer: Self-pay | Source: Ambulatory Visit | Attending: Family Medicine | Admitting: Family Medicine

## 2020-03-06 DIAGNOSIS — M25561 Pain in right knee: Secondary | ICD-10-CM

## 2020-03-06 NOTE — Telephone Encounter (Signed)
Patient have an exacerbation of her underlying knee pain.  Concern for bony abnormality given her severity of her pain.  Will order x-rays.  Counseled on nonweightbearing with either crutches or Rollator.  Myra Rude, MD [facility] 03/06/2020, 3:07 PM

## 2020-03-06 NOTE — Telephone Encounter (Signed)
Patient called reports that knee pain has increase & now swelling to the point she is unable to bare any weight on it-- Pt would like provider's advice on what to do .  --Is OV suggested ? Or what can be done to ease the pain.  --forwarding pt's message to provider to contact her at [phone]   --glh

## 2020-03-07 ENCOUNTER — Telehealth: Payer: Self-pay | Admitting: Family Medicine

## 2020-03-07 NOTE — Telephone Encounter (Signed)
Pt has called [facility] today to see if provider has an update on X-ray taken yesterday--- Advise her that Radiologist just completed review & has forwarding opinion to Dr. Jordan Likes.  --Forwarding 2nd msg to Dr.Schmitz to pls contact pt if Possible before leaving office today. @ Phone:  [phone]   --Fausto Skillern

## 2020-03-07 NOTE — Telephone Encounter (Signed)
Informed of results.   Myra Rude, MD [facility] 03/07/2020, 5:11 PM

## 2020-03-07 NOTE — Telephone Encounter (Signed)
Patient called to see if X-ray result were in from Yesterday--advised do not show radiologist has read/consults w/ Dr. Jordan Likes the findings.  --Forwarding note to provider/med asst that if results come in today to pls contact pt.  -glh

## 2020-04-27 ENCOUNTER — Emergency Department (HOSPITAL_BASED_OUTPATIENT_CLINIC_OR_DEPARTMENT_OTHER)
Admission: EM | Admit: 2020-04-27 | Discharge: 2020-04-27 | Disposition: A | Discharge status: Home / Self Care | Payer: Self-pay | Attending: Emergency Medicine | Admitting: Emergency Medicine

## 2020-04-27 ENCOUNTER — Other Ambulatory Visit: Payer: Self-pay

## 2020-04-27 ENCOUNTER — Encounter (HOSPITAL_BASED_OUTPATIENT_CLINIC_OR_DEPARTMENT_OTHER): Payer: Self-pay | Admitting: *Deleted

## 2020-04-27 DIAGNOSIS — K0889 Other specified disorders of teeth and supporting structures: Secondary | ICD-10-CM | POA: Insufficient documentation

## 2020-04-27 DIAGNOSIS — Z8673 Personal history of transient ischemic attack (TIA), and cerebral infarction without residual deficits: Secondary | ICD-10-CM | POA: Insufficient documentation

## 2020-04-27 DIAGNOSIS — Z7982 Long term (current) use of aspirin: Secondary | ICD-10-CM | POA: Insufficient documentation

## 2020-04-27 DIAGNOSIS — K029 Dental caries, unspecified: Secondary | ICD-10-CM

## 2020-04-27 DIAGNOSIS — F17210 Nicotine dependence, cigarettes, uncomplicated: Secondary | ICD-10-CM | POA: Insufficient documentation

## 2020-04-27 MED ORDER — TRAMADOL HCL 50 MG PO TABS: 50.0000 mg | ORAL_TABLET | Freq: Four times a day (QID) | ORAL | 0 refills | Status: AC | PRN

## 2020-04-27 NOTE — ED Notes (Signed)
Discharge instructions and medications discussed with patient. Verbalized understanding. Departs ED at this time in stable condition.   

## 2020-04-27 NOTE — Discharge Instructions (Addendum)
Continue amoxicillin as previously prescribed.  Begin taking tramadol as prescribed as needed for pain.  Follow-up with your dentist in the next few days.

## 2020-04-27 NOTE — ED Triage Notes (Signed)
Left lower dental pain. Saw dentist yesterday, given abx and mouth rinse. States pain is not any better.

## 2020-04-27 NOTE — ED Provider Notes (Addendum)
[facility] EMERGENCY DEPARTMENT Provider Note   CSN: [id number] Arrival date & time: 04/27/20  6503     History Chief Complaint  Patient presents with  . Dental Pain    Regina Thomas is a 49 y.o. female.  Patient is a 49 year old female presenting with complaints of dental pain.  She has a history of dental caries.  She tells me she was seen by her dentist yesterday and prescribed amoxicillin and mouth rinse, however her pain is not improving.  She denies any fevers or chills.  She denies any difficulty breathing or swallowing.  The history is provided by the patient.  Dental Pain Location:  Generalized Quality:  Constant Severity:  Severe Duration:  2 days Timing:  Constant Progression:  Worsening Chronicity:  Recurrent Context: poor dentition        Past Medical History:  Diagnosis Date  . Hiatal hernia   . Stroke (HCC)   . TIA (transient ischemic attack)     Patient Active Problem List   Diagnosis Date Noted  . Acute pain of right knee 02/09/2020  . Cervical radiculopathy 02/02/2019  . Synovitis of left shoulder 12/03/2018    Past Surgical History:  Procedure Laterality Date  . APPENDECTOMY  1973  . CESAREAN SECTION  1993, 1997  . INNER EAR SURGERY     tubes   . KIDNEY SURGERY  1973  . TONSILLECTOMY  1976     OB History   No obstetric history on file.     Family History  Problem Relation Age of Onset  . Hypertension Mother   . Diabetes Mother   . Hypertension Father   . Diabetes Father   . Cancer Father        melanoma  . Cancer Maternal Grandmother        lung  . Heart attack Maternal Grandfather   . Stroke Maternal Grandfather     Social History   Tobacco Use  . Smoking status: Current Every Day Smoker    Packs/day: 0.50    Years: 15.00    Pack years: 7.50    Types: Cigarettes  . Smokeless tobacco: Never Used  Vaping Use  . Vaping Use: Never used  Substance Use Topics  . Alcohol use: No  . Drug use: No    Home  Medications Prior to Admission medications   Medication Sig Start Date End Date Taking? Authorizing Provider  aspirin 81 MG tablet Take 81 mg by mouth daily.    [provider]  cephALEXin (KEFLEX) 500 MG capsule Take 2 capsules (1,000 mg total) by mouth 2 (two) times daily. 03/13/19   Arby Barrette, MD  chlorhexidine (PERIDEX) 0.12 % solution Use as directed 15 mLs in the mouth or throat 2 (two) times daily. 11/09/19   Mare Ferrari, PA-C  cyclobenzaprine (FLEXERIL) 10 MG tablet Take 1 tablet (10 mg total) by mouth 3 (three) times daily as needed. 02/02/19   Myra Rude, MD  esomeprazole (NEXIUM) 10 MG packet Take 10 mg by mouth daily before breakfast.    [provider]  ferrous sulfate 325 (65 FE) MG tablet Take by mouth.    [provider]  gabapentin (NEURONTIN) 100 MG capsule Take 1 capsule (100 mg total) by mouth 3 (three) times daily. 02/02/19   Myra Rude, MD  lidocaine (XYLOCAINE) 2 % solution Use as directed 15 mLs in the mouth or throat as needed for mouth pain. Swish and spit.  Do not swallow. 08/26/18  Dayton Scrape, Alyssa B, PA-C  metroNIDAZOLE (FLAGYL) 500 MG tablet Take 1 tablet (500 mg total) by mouth 2 (two) times daily. 11/07/19   Fawze, Mina A, PA-C  naproxen (NAPROSYN) 500 MG tablet Take 1 tablet (500 mg total) by mouth 2 (two) times daily as needed. 11/25/18   Dione Booze, MD  predniSONE (DELTASONE) 5 MG tablet Take 6 pills for first day, 5 pills second day, 4 pills third day, 3 pills fourth day, 2 pills the fifth day, and 1 pill sixth day. 02/09/20   Myra Rude, MD  traMADol (ULTRAM) 50 MG tablet Take 2 tablets (100 mg total) by mouth every 6 (six) hours as needed. 03/13/19   Arby Barrette, MD  triamcinolone cream (KENALOG) 0.1 % Apply 1 application topically 2 (two) times daily. 01/27/18   Kirichenko, Lemont Fillers, PA-C  vitamin C (ASCORBIC ACID) 500 MG tablet Take 500 mg by mouth daily.    [provider]    Allergies    Codeine,  Hydrocodone, Penicillins, Percocet [oxycodone-acetaminophen], and Zithromax [azithromycin]  Review of Systems   Review of Systems  All other systems reviewed and are negative.   Physical Exam Updated Vital Signs BP (!) 174/115 (BP Location: Right Arm)   Pulse 81   Temp 98.2 F (36.8 C) (Oral)   Resp 16   SpO2 100%   Physical Exam Vitals and nursing note reviewed.  Constitutional:      General: She is not in acute distress.    Appearance: Normal appearance. She is not ill-appearing, toxic-appearing or diaphoretic.  HENT:     Head: Normocephalic and atraumatic.     Mouth/Throat:     Comments: Patient has a heavily decayed dentition throughout.  There is generalized gingival inflammation, however no definitive sign of abscess. Pulmonary:     Effort: Pulmonary effort is normal.  Skin:    General: Skin is warm and dry.  Neurological:     Mental Status: She is alert.     ED Results / Procedures / Treatments   Labs (all labs ordered are listed, but only abnormal results are displayed) Labs Reviewed - No data to display  EKG None  Radiology No results found.  Procedures Procedures (including critical care time)  Medications Ordered in ED Medications - No data to display  ED Course  I have reviewed the triage vital signs and the nursing notes.  Pertinent labs & imaging results that were available during my care of the patient were reviewed by me and considered in my medical decision making (see chart for details).    MDM Rules/Calculators/A&P  Patient will be advised to continue amoxicillin.  I will prescribe a small quantity of hydrocodone which she can take.  She is to follow-up with her dentist in the next few days.  Final Clinical Impression(s) / ED Diagnoses Final diagnoses:  None    Rx / DC Orders ED Discharge Orders    None       Geoffery Lyons, MD 04/27/20 2563    Geoffery Lyons, MD 04/27/20 [phone]

## 2020-08-24 ENCOUNTER — Ambulatory Visit: Payer: Self-pay | Admitting: Medical

## 2020-08-24 DIAGNOSIS — Z0289 Encounter for other administrative examinations: Secondary | ICD-10-CM

## 2020-10-11 ENCOUNTER — Encounter (HOSPITAL_BASED_OUTPATIENT_CLINIC_OR_DEPARTMENT_OTHER): Payer: Self-pay | Admitting: *Deleted

## 2020-10-11 ENCOUNTER — Emergency Department (HOSPITAL_BASED_OUTPATIENT_CLINIC_OR_DEPARTMENT_OTHER): Payer: Self-pay

## 2020-10-11 ENCOUNTER — Other Ambulatory Visit: Payer: Self-pay

## 2020-10-11 ENCOUNTER — Emergency Department (HOSPITAL_BASED_OUTPATIENT_CLINIC_OR_DEPARTMENT_OTHER)
Admission: EM | Admit: 2020-10-11 | Discharge: 2020-10-11 | Disposition: A | Discharge status: Home / Self Care | Payer: Self-pay | Attending: Emergency Medicine | Admitting: Emergency Medicine

## 2020-10-11 DIAGNOSIS — F17210 Nicotine dependence, cigarettes, uncomplicated: Secondary | ICD-10-CM | POA: Insufficient documentation

## 2020-10-11 DIAGNOSIS — R197 Diarrhea, unspecified: Secondary | ICD-10-CM | POA: Insufficient documentation

## 2020-10-11 DIAGNOSIS — R1031 Right lower quadrant pain: Secondary | ICD-10-CM | POA: Insufficient documentation

## 2020-10-11 DIAGNOSIS — R109 Unspecified abdominal pain: Secondary | ICD-10-CM

## 2020-10-11 DIAGNOSIS — Z7982 Long term (current) use of aspirin: Secondary | ICD-10-CM | POA: Insufficient documentation

## 2020-10-11 LAB — CBC WITH DIFFERENTIAL/PLATELET
Abs Immature Granulocytes: 0.01 10*3/uL (ref 0.00–0.07)
Basophils Absolute: 0.1 10*3/uL (ref 0.0–0.1)
Basophils Relative: 1 %
Eosinophils Absolute: 0.1 10*3/uL (ref 0.0–0.5)
Eosinophils Relative: 1 %
HCT: 38.7 % (ref 36.0–46.0)
Hemoglobin: 12.8 g/dL (ref 12.0–15.0)
Immature Granulocytes: 0 %
Lymphocytes Relative: 31 %
Lymphs Abs: 2 10*3/uL (ref 0.7–4.0)
MCH: 31.1 pg (ref 26.0–34.0)
MCHC: 33.1 g/dL (ref 30.0–36.0)
MCV: 94.2 fL (ref 80.0–100.0)
Monocytes Absolute: 0.5 10*3/uL (ref 0.1–1.0)
Monocytes Relative: 8 %
Neutro Abs: 3.7 10*3/uL (ref 1.7–7.7)
Neutrophils Relative %: 59 %
Platelets: 285 10*3/uL (ref 150–400)
RBC: 4.11 MIL/uL (ref 3.87–5.11)
RDW: 13.2 % (ref 11.5–15.5)
WBC: 6.3 10*3/uL (ref 4.0–10.5)
nRBC: 0 % (ref 0.0–0.2)

## 2020-10-11 LAB — URINALYSIS, MICROSCOPIC (REFLEX)

## 2020-10-11 LAB — URINALYSIS, ROUTINE W REFLEX MICROSCOPIC
Bilirubin Urine: NEGATIVE
Glucose, UA: NEGATIVE mg/dL
Ketones, ur: NEGATIVE mg/dL
Leukocytes,Ua: NEGATIVE
Nitrite: NEGATIVE
Protein, ur: NEGATIVE mg/dL
Specific Gravity, Urine: <1.005 — ABNORMAL LOW (ref 1.005–1.030)
pH: 7 (ref 5.0–8.0)

## 2020-10-11 LAB — COMPREHENSIVE METABOLIC PANEL
ALT: 16 U/L (ref 0–44)
AST: 20 U/L (ref 15–41)
Albumin: 4 g/dL (ref 3.5–5.0)
Alkaline Phosphatase: 66 U/L (ref 38–126)
Anion gap: 8 (ref 5–15)
BUN: 8 mg/dL (ref 6–20)
CO2: 25 mmol/L (ref 22–32)
Calcium: 9.1 mg/dL (ref 8.9–10.3)
Chloride: 105 mmol/L (ref 98–111)
Creatinine, Ser: 0.45 mg/dL (ref 0.44–1.00)
GFR, Estimated: >60 mL/min (ref >60)
Glucose, Bld: 89 mg/dL (ref 70–99)
Potassium: 4.2 mmol/L (ref 3.5–5.1)
Sodium: 138 mmol/L (ref 135–145)
Total Bilirubin: 0.2 mg/dL — ABNORMAL LOW (ref 0.3–1.2)
Total Protein: 6.9 g/dL (ref 6.5–8.1)

## 2020-10-11 LAB — LIPASE, BLOOD: Lipase: 54 U/L — ABNORMAL HIGH (ref 11–51)

## 2020-10-11 NOTE — Discharge Instructions (Signed)
You were provided with the results of your CT renal.  We discussed taking some Miralax over the counter to help with your constipation. Please follow-up with your primary care physician as needed.

## 2020-10-11 NOTE — ED Provider Notes (Signed)
[facility] EMERGENCY DEPARTMENT Provider Note   CSN: [id number] Arrival date & time: 10/11/20  1303     History Chief Complaint  Patient presents with  . Flank Pain    Regina Thomas is a 50 y.o. female.  50 y.o female with a past medical history of stroke, TIA presents to the ED with a chief complaint of right flank pain and urinary symptoms for the past 5 days.  Patient endorses a sharp pain to the right flank with radiation onto her right lower quadrant.  This is exacerbated with urination, reports some discomfort while doing so.  No alleviating factors.  She does have an appointment scheduled with for his urology for upcoming Monday, however felt that the pain was too severe for her to wait.  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  He has taken some Tylenol to help with pain however pain is intermittent and returns.  She also describes one episode of diarrhea, however does report she has a previous history of hernia and ate something spicy so she is unsure whether this attributing to her symptoms.  There has not been any fever, nausea, vomiting, gynecological complaints.   The history is provided by the patient.  Flank Pain This is a new problem. The current episode started more than 2 days ago. The problem occurs constantly. Associated symptoms include abdominal pain. Pertinent negatives include no chest pain, no headaches and no shortness of breath.       Past Medical History:  Diagnosis Date  . Hiatal hernia   . Stroke (HCC)   . TIA (transient ischemic attack)     Patient Active Problem List   Diagnosis Date Noted  . Acute pain of right knee 02/09/2020  . Cervical radiculopathy 02/02/2019  . Synovitis of left shoulder 12/03/2018    Past Surgical History:  Procedure Laterality Date  . APPENDECTOMY  1973  . CESAREAN SECTION  1993, 1997   . INNER EAR SURGERY     tubes   . KIDNEY SURGERY  1973  . TONSILLECTOMY  1976     OB History   No obstetric history on file.     Family History  Problem Relation Age of Onset  . Hypertension Mother   . Diabetes Mother   . Hypertension Father   . Diabetes Father   . Cancer Father        melanoma  . Cancer Maternal Grandmother        lung  . Heart attack Maternal Grandfather   . Stroke Maternal Grandfather     Social History   Tobacco Use  . Smoking status: Current Every Day Smoker    Packs/day: 0.50    Years: 15.00    Pack years: 7.50    Types: Cigarettes  . Smokeless tobacco: Never Used  Vaping Use  . Vaping Use: Never used  Substance Use Topics  . Alcohol use: No  . Drug use: No    Home Medications Prior to Admission medications   Medication Sig Start Date End Date Taking? Authorizing Provider  aspirin 81 MG tablet Take 81 mg by mouth daily.    [provider]  cephALEXin (KEFLEX) 500 MG capsule Take 2 capsules (1,000 mg total) by mouth 2 (two) times daily. 03/13/19   Arby Barrette, MD  chlorhexidine (PERIDEX) 0.12 % solution Use as directed  15 mLs in the mouth or throat 2 (two) times daily. 11/09/19   Mare Ferrari, PA-C  cyclobenzaprine (FLEXERIL) 10 MG tablet Take 1 tablet (10 mg total) by mouth 3 (three) times daily as needed. 02/02/19   Myra Rude, MD  esomeprazole (NEXIUM) 10 MG packet Take 10 mg by mouth daily before breakfast.    [provider]  ferrous sulfate 325 (65 FE) MG tablet Take by mouth.    [provider]  gabapentin (NEURONTIN) 100 MG capsule Take 1 capsule (100 mg total) by mouth 3 (three) times daily. 02/02/19   Myra Rude, MD  lidocaine (XYLOCAINE) 2 % solution Use as directed 15 mLs in the mouth or throat as needed for mouth pain. Swish and spit.  Do not swallow. 08/26/18   Aviva Kluver B, PA-C  metroNIDAZOLE (FLAGYL) 500 MG tablet Take 1 tablet (500 mg total) by mouth 2 (two) times daily. 11/07/19    Fawze, Mina A, PA-C  naproxen (NAPROSYN) 500 MG tablet Take 1 tablet (500 mg total) by mouth 2 (two) times daily as needed. 11/25/18   Dione Booze, MD  predniSONE (DELTASONE) 5 MG tablet Take 6 pills for first day, 5 pills second day, 4 pills third day, 3 pills fourth day, 2 pills the fifth day, and 1 pill sixth day. 02/09/20   Myra Rude, MD  traMADol (ULTRAM) 50 MG tablet Take 1 tablet (50 mg total) by mouth every 6 (six) hours as needed. 04/27/20   Geoffery Lyons, MD  triamcinolone cream (KENALOG) 0.1 % Apply 1 application topically 2 (two) times daily. 01/27/18   Kirichenko, Lemont Fillers, PA-C  vitamin C (ASCORBIC ACID) 500 MG tablet Take 500 mg by mouth daily.    [provider]    Allergies    Codeine, Hydrocodone, Penicillins, Percocet [oxycodone-acetaminophen], and Zithromax [azithromycin]  Review of Systems   Review of Systems  Constitutional: Negative for fever.  Respiratory: Negative for shortness of breath.   Cardiovascular: Negative for chest pain.  Gastrointestinal: Positive for abdominal pain. Negative for nausea and vomiting.  Genitourinary: Positive for flank pain.  Musculoskeletal: Negative for back pain.  Neurological: Negative for light-headedness and headaches.  All other systems reviewed and are negative.   Physical Exam Updated Vital Signs BP 130/81   Pulse 62   Temp 98.9 F (37.2 C) (Oral)   Resp 17   Ht 5\' 7"  (1.702 m)   Wt 68 kg   LMP 10/24/2019   SpO2 98%   BMI 23.49 kg/m   Physical Exam Vitals and nursing note reviewed.  Constitutional:      Appearance: Normal appearance.  HENT:     Head: Normocephalic and atraumatic.     Mouth/Throat:     Mouth: Mucous membranes are moist.  Eyes:     Pupils: Pupils are equal, round, and reactive to light.  Cardiovascular:     Rate and Rhythm: Normal rate.  Pulmonary:     Effort: Pulmonary effort is normal.     Breath sounds: No wheezing or rales.  Abdominal:     General: Abdomen is flat.      Palpations: Abdomen is soft.     Tenderness: There is abdominal tenderness. There is right CVA tenderness.  Musculoskeletal:     Cervical back: Normal range of motion and neck supple.  Skin:    General: Skin is warm and dry.  Neurological:     Mental Status: She is alert and oriented to person, place, and time.  ED Results / Procedures / Treatments   Labs (all labs ordered are listed, but only abnormal results are displayed) Labs Reviewed  URINALYSIS, ROUTINE W REFLEX MICROSCOPIC - Abnormal; Notable for the following components:      Result Value   APPearance CLOUDY (*)    Specific Gravity, Urine <1.005 (*)    Hgb urine dipstick SMALL (*)    All other components within normal limits  COMPREHENSIVE METABOLIC PANEL - Abnormal; Notable for the following components:   Total Bilirubin 0.2 (*)    All other components within normal limits  LIPASE, BLOOD - Abnormal; Notable for the following components:   Lipase 54 (*)    All other components within normal limits  URINALYSIS, MICROSCOPIC (REFLEX) - Abnormal; Notable for the following components:   Bacteria, UA FEW (*)    All other components within normal limits  CBC WITH DIFFERENTIAL/PLATELET    EKG None  Radiology CT Renal Stone Study  Result Date: 10/11/2020 CLINICAL DATA:  Right flank pain and dysuria for 5 days. EXAM: CT ABDOMEN AND PELVIS WITHOUT CONTRAST TECHNIQUE: Multidetector CT imaging of the abdomen and pelvis was performed following the standard protocol without IV contrast. COMPARISON:  08/06/2017. FINDINGS: Lower chest: Lung bases are clear. Heart size normal. No pericardial or pleural effusion. Distal esophagus is unremarkable. Hepatobiliary: Liver is enlarged, measuring 19.0 cm. Liver and gallbladder are otherwise unremarkable. No biliary ductal dilatation. Pancreas: Negative. Spleen: Negative. Adrenals/Urinary Tract: Adrenal glands are unremarkable. 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 Right ureter is decompressed. Kidneys are otherwise unremarkable. Left ureter is decompressed. Bladder is grossly unremarkable. Stomach/Bowel: Stomach, small bowel, appendix and colon are unremarkable. Fair amount of stool is seen in the colon, indicative of constipation. Vascular/Lymphatic: Atherosclerotic calcification of the aorta. No pathologically enlarged lymph nodes. Reproductive: Uterus is visualized.  No adnexal mass. Other: No free fluid.  Mesenteries and peritoneum are unremarkable. Musculoskeletal: No worrisome lytic or sclerotic lesions. IMPRESSION: 1. No acute findings to explain the patient's clinical history. 2. Hepatomegaly. 3. Fair amount of stool in the colon is indicative of constipation. 4.  Aortic atherosclerosis (ICD10-I70.0). Electronically Signed   By: Leanna Battles M.D.   On: 10/11/2020 15:06    Procedures Procedures   Medications Ordered in ED Medications - No data to display  ED Course  I have reviewed the triage vital signs and the nursing notes.  Pertinent labs & imaging results that were available during my care of the patient were reviewed by me and considered in my medical decision making (see chart for details).  Clinical Course as of 10/11/20 1551  Wed Oct 11, 2020  1400 Bacteria, UA(!): FEW [JS]  1400 Hgb urine dipstick(!): SMALL [JS]  1511 Lipase(!): 54 [JS]    Clinical Course User Index [JS] Claude Manges, PA-C   MDM Rules/Calculators/A&P   Patient presents to the ED with a chief complaint of right flank pain, dysuria for the past 5 days.  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  Does have a history of recurrent UTIs, currently has not been on any antibiotics recently.  No fever, nausea or vomiting.  During evaluation patient is overall well-appearing, vitals are within normal limits on arrival, she is afebrile.  Abdomen is soft, nontender to palpation, there is right flank tenderness  significant, none of those on the left flank.  Lungs are clear to auscultation, chest is nontender to palpation.  Bilateral legs without  any swelling.  Oropharynx is clear.  UA with few bacteria, small hemoglobin, no white blood cell count or leukocytes.  Squamous present on her UA, specimen not clean.  CT Renal showed: 1. No acute findings to explain the patient's clinical history.  2. Hepatomegaly.  3. Fair amount of stool in the colon is indicative of constipation.  4. Aortic atherosclerosis (ICD10-I70.0).     These results were discussed at length with patient, she was provided with a copy of her CT results.  She does report discomfort with urination, we discussed continuing to follow-up with urology as needed.  We will also discuss MiraLAX treatment in order to help with her constipation.  Patient understands and agrees with management.  Return precautions discussed at length.  Patient stable for discharge.  Portions of this note were generated with Scientist, clinical (histocompatibility and immunogenetics)Dragon dictation software. Dictation errors may occur despite best attempts at proofreading.  Final Clinical Impression(s) / ED Diagnoses Final diagnoses:  Right flank pain    Rx / DC Orders ED Discharge Orders    None       Claude MangesSoto, Sharian Delia, PA-C 10/11/20 1551    Linwood DibblesKnapp, Jon, MD 10/12/20 1541

## 2020-10-11 NOTE — ED Triage Notes (Signed)
C/o right flank pain and dysuria x 5 days

## 2020-12-05 ENCOUNTER — Other Ambulatory Visit: Payer: Self-pay

## 2020-12-05 ENCOUNTER — Encounter (HOSPITAL_BASED_OUTPATIENT_CLINIC_OR_DEPARTMENT_OTHER): Payer: Self-pay | Admitting: *Deleted

## 2020-12-05 ENCOUNTER — Emergency Department (HOSPITAL_BASED_OUTPATIENT_CLINIC_OR_DEPARTMENT_OTHER)
Admission: EM | Admit: 2020-12-05 | Discharge: 2020-12-05 | Discharge status: Against Medical Advice | Payer: Self-pay | Attending: Emergency Medicine | Admitting: Emergency Medicine

## 2020-12-05 DIAGNOSIS — Z5321 Procedure and treatment not carried out due to patient leaving prior to being seen by health care provider: Secondary | ICD-10-CM | POA: Insufficient documentation

## 2020-12-05 DIAGNOSIS — M79604 Pain in right leg: Secondary | ICD-10-CM | POA: Insufficient documentation

## 2020-12-05 DIAGNOSIS — M7989 Other specified soft tissue disorders: Secondary | ICD-10-CM | POA: Insufficient documentation

## 2020-12-05 NOTE — ED Provider Notes (Signed)
Contacted by RN who reports that patient left AGAINST MEDICAL ADVICE due to a sick grandchild.  Patient signed AMA form.  Patient left prior to being evaluated by provider.   Haskel Schroeder, PA-C 12/05/20 1416    Tilden Fossa, MD 12/08/20 [id number]

## 2020-12-05 NOTE — ED Triage Notes (Signed)
Pain in her right leg. Swelling. She is wearing a knee brace that is very tight.

## 2021-08-04 ENCOUNTER — Emergency Department (HOSPITAL_BASED_OUTPATIENT_CLINIC_OR_DEPARTMENT_OTHER): Payer: Self-pay

## 2021-08-04 ENCOUNTER — Other Ambulatory Visit: Payer: Self-pay

## 2021-08-04 ENCOUNTER — Emergency Department (HOSPITAL_BASED_OUTPATIENT_CLINIC_OR_DEPARTMENT_OTHER)
Admission: EM | Admit: 2021-08-04 | Discharge: 2021-08-04 | Disposition: A | Discharge status: Home / Self Care | Payer: Self-pay | Attending: Emergency Medicine | Admitting: Emergency Medicine

## 2021-08-04 ENCOUNTER — Encounter (HOSPITAL_BASED_OUTPATIENT_CLINIC_OR_DEPARTMENT_OTHER): Payer: Self-pay | Admitting: Emergency Medicine

## 2021-08-04 DIAGNOSIS — R079 Chest pain, unspecified: Secondary | ICD-10-CM | POA: Insufficient documentation

## 2021-08-04 DIAGNOSIS — R531 Weakness: Secondary | ICD-10-CM | POA: Insufficient documentation

## 2021-08-04 DIAGNOSIS — Z7982 Long term (current) use of aspirin: Secondary | ICD-10-CM | POA: Insufficient documentation

## 2021-08-04 DIAGNOSIS — R519 Headache, unspecified: Secondary | ICD-10-CM | POA: Insufficient documentation

## 2021-08-04 DIAGNOSIS — R200 Anesthesia of skin: Secondary | ICD-10-CM | POA: Insufficient documentation

## 2021-08-04 DIAGNOSIS — Z79899 Other long term (current) drug therapy: Secondary | ICD-10-CM | POA: Insufficient documentation

## 2021-08-04 DIAGNOSIS — R29810 Facial weakness: Secondary | ICD-10-CM | POA: Insufficient documentation

## 2021-08-04 LAB — CBC
HCT: 40.9 % (ref 36.0–46.0)
Hemoglobin: 13.5 g/dL (ref 12.0–15.0)
MCH: 30.8 pg (ref 26.0–34.0)
MCHC: 33 g/dL (ref 30.0–36.0)
MCV: 93.2 fL (ref 80.0–100.0)
Platelets: 257 10*3/uL (ref 150–400)
RBC: 4.39 MIL/uL (ref 3.87–5.11)
RDW: 13.6 % (ref 11.5–15.5)
WBC: 7.4 10*3/uL (ref 4.0–10.5)
nRBC: 0 % (ref 0.0–0.2)

## 2021-08-04 LAB — BASIC METABOLIC PANEL
Anion gap: 9 (ref 5–15)
BUN: 9 mg/dL (ref 6–20)
CO2: 27 mmol/L (ref 22–32)
Calcium: 9.3 mg/dL (ref 8.9–10.3)
Chloride: 102 mmol/L (ref 98–111)
Creatinine, Ser: 0.58 mg/dL (ref 0.44–1.00)
GFR, Estimated: >60 mL/min (ref >60)
Glucose, Bld: 89 mg/dL (ref 70–99)
Potassium: 4.1 mmol/L (ref 3.5–5.1)
Sodium: 138 mmol/L (ref 135–145)

## 2021-08-04 LAB — TROPONIN I (HIGH SENSITIVITY): Troponin I (High Sensitivity): <2 ng/L (ref <18)

## 2021-08-04 NOTE — Discharge Instructions (Signed)
You have decided to leave before the completion of your work-up.  As such, we have not completely ruled out any concerning or potentially life-threatening conditions. You are welcome to return to the ER at any time to finish your work-up. I have put a referral into the neurologist, and included their office information listed below.  It is extremely important that you follow-up with them. I have also put in information for the cardiologist for further evaluation of your chest pain. Return to the emergency room with any new or worsening, concerning symptoms.

## 2021-08-04 NOTE — ED Provider Notes (Signed)
[facility] EMERGENCY DEPARTMENT Provider Note   CSN: [id number] Arrival date & time: 08/04/21  1143     History  Chief Complaint  Patient presents with   Chest Pain    Regina Thomas is a 51 y.o. female presenting for evaluation of chest pain and left arm numbness and weakness. Patient states that the past 3 to 4 days, she has had intermittent left arm numbness and weakness.  She states one time when this occurred, she also had left-sided facial droop.  The symptoms last for several minutes before resolving.  She reports a previous history of stress-induced TIA, but has never followed up with neurology.  She states she has been very stressed recently.  Additionally, she reports chest pain that began today.  Pain is constant and mild.  Not worse with inspiration.  No fevers, cough, nausea, vomiting, abdominal pain, urinary symptoms, abnormal bowel movements.  No numbness of the legs.  She did have a headache yesterday, but this is since resolved.  No vision changes or slurred speech.  HPI     Home Medications Prior to Admission medications   Medication Sig Start Date End Date Taking? Authorizing Provider  amLODipine (NORVASC) 5 MG tablet Take 1 tablet by mouth daily. 08/15/20   [provider]  aspirin 81 MG tablet Take 81 mg by mouth daily.    [provider]  cephALEXin (KEFLEX) 500 MG capsule Take 2 capsules (1,000 mg total) by mouth 2 (two) times daily. 03/13/19   Charlesetta Shanks, MD  chlorhexidine (PERIDEX) 0.12 % solution Use as directed 15 mLs in the mouth or throat 2 (two) times daily. 11/09/19   Garald Balding, PA-C  cyclobenzaprine (FLEXERIL) 10 MG tablet Take 1 tablet (10 mg total) by mouth 3 (three) times daily as needed. 02/02/19   Rosemarie Ax, MD  esomeprazole (NEXIUM) 10 MG packet Take 10 mg by mouth daily before breakfast.    [provider]  ferrous sulfate 325 (65 FE) MG tablet Take by mouth.    [provider]  gabapentin  (NEURONTIN) 100 MG capsule Take 1 capsule (100 mg total) by mouth 3 (three) times daily. 02/02/19   Rosemarie Ax, MD  lidocaine (XYLOCAINE) 2 % solution Use as directed 15 mLs in the mouth or throat as needed for mouth pain. Swish and spit.  Do not swallow. 08/26/18   Langston Masker B, PA-C  metroNIDAZOLE (FLAGYL) 500 MG tablet Take 1 tablet (500 mg total) by mouth 2 (two) times daily. 11/07/19   Fawze, Mina A, PA-C  naproxen (NAPROSYN) 500 MG tablet Take 1 tablet (500 mg total) by mouth 2 (two) times daily as needed. [id number]   Delora Fuel, MD  predniSONE (DELTASONE) 5 MG tablet Take 6 pills for first day, 5 pills second day, 4 pills third day, 3 pills fourth day, 2 pills the fifth day, and 1 pill sixth day. 02/09/20   Rosemarie Ax, MD  traMADol (ULTRAM) 50 MG tablet Take 1 tablet (50 mg total) by mouth every 6 (six) hours as needed. 04/27/20   Veryl Speak, MD  triamcinolone cream (KENALOG) 0.1 % Apply 1 application topically 2 (two) times daily. 01/27/18   Kirichenko, Lahoma Rocker, PA-C  vitamin C (ASCORBIC ACID) 500 MG tablet Take 500 mg by mouth daily.    [provider]      Allergies    Codeine, Hydrocodone, Penicillins, Percocet [oxycodone-acetaminophen], and Zithromax [azithromycin]    Review of Systems   Review of Systems  Cardiovascular:  Positive for chest pain.  Neurological:  Positive for facial asymmetry (resolved), weakness (intermittent, none currently), numbness (intermittent, none currently) and headaches (resolved).  All other systems reviewed and are negative.  Physical Exam Updated Vital Signs BP 131/83 (BP Location: Right Arm)    Pulse 64    Temp 98.3 F (36.8 C) (Oral)    Resp 18    Ht 5\' 7"  (1.702 m)    Wt 63.5 kg    LMP 10/24/2019    SpO2 98%    BMI 21.93 kg/m  Physical Exam Vitals and nursing note reviewed.  Constitutional:      General: She is not in acute distress.    Appearance: Normal appearance.     Comments: Resting in the bed in no acute distress   HENT:     Head: Normocephalic and atraumatic.     Comments: Right TM perfect without signs of infection, known by patient Eyes:     Extraocular Movements: Extraocular movements intact.     Conjunctiva/sclera: Conjunctivae normal.     Pupils: Pupils are equal, round, and reactive to light.  Cardiovascular:     Rate and Rhythm: Normal rate and regular rhythm.     Pulses: Normal pulses.  Pulmonary:     Effort: Pulmonary effort is normal. No respiratory distress.     Breath sounds: Normal breath sounds. No wheezing.     Comments: Speaking in full sentences.  Clear lung sounds in all fields. Chest:     Chest wall: No tenderness.  Abdominal:     General: There is no distension.     Palpations: Abdomen is soft. There is no mass.     Tenderness: There is no abdominal tenderness. There is no guarding or rebound.  Musculoskeletal:        General: Normal range of motion.     Cervical back: Normal range of motion and neck supple.     Right lower leg: No edema.     Left lower leg: No edema.     Comments: Strength and sensation intact x4  Skin:    General: Skin is warm and dry.     Capillary Refill: Capillary refill takes less than 2 seconds.  Neurological:     General: No focal deficit present.     Mental Status: She is alert and oriented to person, place, and time.     GCS: GCS eye subscore is 4. GCS verbal subscore is 5. GCS motor subscore is 6.     Cranial Nerves: Cranial nerves 2-12 are intact.     Sensory: Sensation is intact.     Motor: Motor function is intact. No pronator drift.     Coordination: Coordination is intact. Finger-Nose-Finger Test normal.     Comments: No neurologic deficits noted.  CN intact.  Nose to finger intact.  Fine movement and coordination intact.  Psychiatric:        Mood and Affect: Mood and affect normal.        Speech: Speech normal.        Behavior: Behavior normal.    ED Results / Procedures / Treatments   Labs (all labs ordered are listed, but  only abnormal results are displayed) Labs Reviewed  BASIC METABOLIC PANEL  CBC  TROPONIN I (HIGH SENSITIVITY)  TROPONIN I (HIGH SENSITIVITY)    EKG EKG Interpretation  Date/Time:  Saturday August 04 2021 11:53:06 EST Ventricular Rate:  77 PR Interval:  156 QRS Duration: 78 QT Interval:  366  QTC Calculation: 414 R Axis:   79 Text Interpretation: Normal sinus rhythm Cannot rule out Anterior infarct , age undetermined Abnormal ECG When compared with ECG of 21-Feb-2020 00:57, PREVIOUS ECG IS PRESENT Confirmed by Thamas Jaegers ([id number]) on 08/04/2021 1:30:47 PM  Radiology DG Chest 2 View  Result Date: 08/04/2021 CLINICAL DATA:  Left chest pain EXAM: CHEST - 2 VIEW COMPARISON:  None. FINDINGS: The heart size and mediastinal contours are within normal limits. Both lungs are clear. The visualized skeletal structures are unremarkable. IMPRESSION: No active cardiopulmonary disease. Electronically Signed   By: Davina Poke D.O.   On: 08/04/2021 13:00   CT Head Wo Contrast  Result Date: 08/04/2021 CLINICAL DATA:  Left arm pain and numbness. Transient ischemic attack. EXAM: CT HEAD WITHOUT CONTRAST TECHNIQUE: Contiguous axial images were obtained from the base of the skull through the vertex without intravenous contrast. RADIATION DOSE REDUCTION: This exam was performed according to the departmental dose-optimization program which includes automated exposure control, adjustment of the mA and/or kV according to patient size and/or use of iterative reconstruction technique. COMPARISON:  February 21, 2020. FINDINGS: Brain: No evidence of acute infarction, hemorrhage, hydrocephalus, extra-axial collection or mass lesion/mass effect. Vascular: No hyperdense vessel or unexpected calcification. Skull: Normal. Negative for fracture or focal lesion. Sinuses/Orbits: Left maxillary sinusitis. Other: None. IMPRESSION: Left maxillary sinusitis.  No acute intracranial abnormality seen. Electronically Signed   By:  Marijo Conception M.D.   On: 08/04/2021 14:39    Procedures Procedures    Medications Ordered in ED Medications - No data to display  ED Course/ Medical Decision Making/ A&P                           Medical Decision Making Amount and/or Complexity of Data Reviewed Labs: ordered. Radiology: ordered.    This patient presents to the ED for concern of chest pain and left arm numbness and weakness. This involves an extensive number of treatment options, and is a complaint that carries with it a high risk of complications and morbidity.  The differential diagnosis includes TIA, CVA, complex migraine, ACS, MSK pain, infection   Co morbidities:  Reported previous TIA   Lab Tests: Labs obtained from triage independently interpreted by me, overall reassuring.  No leukocytosis.  Electrolytes stable.  Initial troponin negative.  Patient left prior to second troponin being drawn   Imaging Studies:  X-ray ordered from triage viewed and independently interpreted by me, no pneumonia pneumothorax, effusion.  CT head negative for acute findings.  MRI of the brain ordered, patient left before starting the scan.   Cardiac Monitoring:  The patient was maintained on a cardiac monitor.  I personally viewed and interpreted the cardiac monitored which showed an underlying rhythm of: nsr   Reevaluation: Informed by RN that patient was requesting to leave.  She has family who needs her help at home.  I discussed with patient that her work-up is not complete, and we have not ruled out a concerning or life-threatening condition.  Patient's daughter was present during this conversation. Patient appears to have capacity to make her own medical decisions at this time.  She is requesting to leave despite acknowledging that her work-up is not complete.  I discussed importance of outpatient work-up and informed her she is welcome to return to the ER at any time to finish her work-up.  Disposition:  Pt  left AMA   Final Clinical Impression(s) / ED Diagnoses  Final diagnoses:  None    Rx / DC Orders ED Discharge Orders     None         Franchot Heidelberg, PA-C 08/04/21 1543    Luna Fuse, MD 08/08/21 [phone]

## 2021-08-04 NOTE — ED Triage Notes (Signed)
Pt arrives pov with c/o left side CP and left arm pain & numbness x 3 days. Pt denies shob, ambulatory to triage

## 2021-08-04 NOTE — ED Notes (Signed)
ED Provider at bedside. 

## 2021-08-04 NOTE — ED Notes (Signed)
Patient transported to CT 

## 2021-08-04 NOTE — ED Notes (Signed)
Attempt x 1 to draw 2nd troponin. Pt c/o pain and requested needle to be removed and declined additional blood draw. Also states "If my CT is okay I need to be discharged. I have a family emergency". EDPA Sophia made aware

## 2021-12-01 ENCOUNTER — Other Ambulatory Visit: Payer: Self-pay

## 2021-12-01 ENCOUNTER — Encounter (HOSPITAL_BASED_OUTPATIENT_CLINIC_OR_DEPARTMENT_OTHER): Payer: Self-pay | Admitting: Emergency Medicine

## 2021-12-01 DIAGNOSIS — R350 Frequency of micturition: Secondary | ICD-10-CM | POA: Insufficient documentation

## 2021-12-01 DIAGNOSIS — Z7982 Long term (current) use of aspirin: Secondary | ICD-10-CM | POA: Insufficient documentation

## 2021-12-01 DIAGNOSIS — R1031 Right lower quadrant pain: Secondary | ICD-10-CM | POA: Insufficient documentation

## 2021-12-01 NOTE — ED Triage Notes (Signed)
Reports low back pain with urinary burning for the last two days.

## 2021-12-02 ENCOUNTER — Emergency Department (HOSPITAL_BASED_OUTPATIENT_CLINIC_OR_DEPARTMENT_OTHER): Payer: Self-pay

## 2021-12-02 ENCOUNTER — Emergency Department (HOSPITAL_BASED_OUTPATIENT_CLINIC_OR_DEPARTMENT_OTHER)
Admission: EM | Admit: 2021-12-02 | Discharge: 2021-12-02 | Disposition: A | Discharge status: Home / Self Care | Payer: Self-pay | Attending: Emergency Medicine | Admitting: Emergency Medicine

## 2021-12-02 DIAGNOSIS — R109 Unspecified abdominal pain: Secondary | ICD-10-CM

## 2021-12-02 LAB — URINALYSIS, ROUTINE W REFLEX MICROSCOPIC
Bilirubin Urine: NEGATIVE
Glucose, UA: NEGATIVE mg/dL
Ketones, ur: NEGATIVE mg/dL
Nitrite: NEGATIVE
Protein, ur: NEGATIVE mg/dL
Specific Gravity, Urine: 1.02 (ref 1.005–1.030)
pH: 7 (ref 5.0–8.0)

## 2021-12-02 LAB — URINALYSIS, MICROSCOPIC (REFLEX)

## 2021-12-02 NOTE — ED Notes (Addendum)
Pt wishes to go home and follow up with her kidney doctor. She does not want blood work or her CT scan.  Dr Bernette Mayers went and talked to patient and agreed to let her go.

## 2021-12-02 NOTE — ED Provider Notes (Signed)
[facility] EMERGENCY DEPARTMENT  Provider Note  CSN: [id number] Arrival date & time: 12/01/21 2325  History Chief Complaint  Patient presents with   Back Pain   Urinary Frequency    Regina Thomas is a 51 y.o. female reports 2 days of R lower back pain, radiating around to RLQ associated with urinary frequency and burning. She has not had a fever or vomiting. 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 She reports history of UTI.    Home Medications Prior to Admission medications   Medication Sig Start Date End Date Taking? Authorizing Provider  amLODipine (NORVASC) 5 MG tablet Take 1 tablet by mouth daily. 08/15/20   [provider]  aspirin 81 MG tablet Take 81 mg by mouth daily.    [provider]  cephALEXin (KEFLEX) 500 MG capsule Take 2 capsules (1,000 mg total) by mouth 2 (two) times daily. 03/13/19   Arby Barrette, MD  chlorhexidine (PERIDEX) 0.12 % solution Use as directed 15 mLs in the mouth or throat 2 (two) times daily. 11/09/19   Mare Ferrari, PA-C  cyclobenzaprine (FLEXERIL) 10 MG tablet Take 1 tablet (10 mg total) by mouth 3 (three) times daily as needed. 02/02/19   Myra Rude, MD  esomeprazole (NEXIUM) 10 MG packet Take 10 mg by mouth daily before breakfast.    [provider]  ferrous sulfate 325 (65 FE) MG tablet Take by mouth.    [provider]  gabapentin (NEURONTIN) 100 MG capsule Take 1 capsule (100 mg total) by mouth 3 (three) times daily. 02/02/19   Myra Rude, MD  lidocaine (XYLOCAINE) 2 % solution Use as directed 15 mLs in the mouth or throat as needed for mouth pain. Swish and spit.  Do not swallow. 08/26/18   Aviva Kluver B, PA-C  metroNIDAZOLE (FLAGYL) 500 MG tablet Take 1 tablet (500 mg total) by mouth 2 (two) times daily. 11/07/19   Fawze, Mina A, PA-C  naproxen (NAPROSYN) 500 MG tablet Take 1 tablet (500 mg total) by mouth 2 (two) times daily as needed. 11/25/18   Dione Booze, MD   predniSONE (DELTASONE) 5 MG tablet Take 6 pills for first day, 5 pills second day, 4 pills third day, 3 pills fourth day, 2 pills the fifth day, and 1 pill sixth day. 02/09/20   Myra Rude, MD  traMADol (ULTRAM) 50 MG tablet Take 1 tablet (50 mg total) by mouth every 6 (six) hours as needed. 04/27/20   Geoffery Lyons, MD  triamcinolone cream (KENALOG) 0.1 % Apply 1 application topically 2 (two) times daily. 01/27/18   Kirichenko, Lemont Fillers, PA-C  vitamin C (ASCORBIC ACID) 500 MG tablet Take 500 mg by mouth daily.    [provider]     Allergies    Codeine, Hydrocodone, Penicillins, Percocet [oxycodone-acetaminophen], and Zithromax [azithromycin]   Review of Systems   Review of Systems Please see HPI for pertinent positives and negatives  Physical Exam BP (!) 131/97 (BP Location: Left Arm)   Pulse 87   Temp 98.5 F (36.9 C) (Oral)   Resp 18   Ht 5\' 7"  (1.702 m)   Wt 63.5 kg   LMP 10/24/2019   SpO2 100%   BMI 21.93 kg/m   Physical Exam Vitals and nursing note reviewed.  Constitutional:      Appearance: Normal appearance.  HENT:     Head: Normocephalic and atraumatic.     Nose: Nose normal.  Mouth/Throat:     Mouth: Mucous membranes are moist.  Eyes:     Extraocular Movements: Extraocular movements intact.     Conjunctiva/sclera: Conjunctivae normal.  Cardiovascular:     Rate and Rhythm: Normal rate.  Pulmonary:     Effort: Pulmonary effort is normal.     Breath sounds: Normal breath sounds.  Abdominal:     General: Abdomen is flat.     Palpations: Abdomen is soft.     Tenderness: There is no abdominal tenderness. There is no guarding.  Musculoskeletal:        General: Tenderness (R lumbar paraspinal muscles) present. No swelling. Normal range of motion.     Cervical back: Neck supple.  Skin:    General: Skin is warm and dry.  Neurological:     General: No focal deficit present.     Mental Status: She is alert.  Psychiatric:        Mood and  Affect: Mood normal.    ED Results / Procedures / Treatments   EKG None  Procedures Procedures  Medications Ordered in the ED Medications - No data to display  Initial Impression and Plan  Patient with R flank pain and dysuria. UA done in triage is negative for infection. Consider MSK pain, ureteral colic. Will check labs and CT.   ED Course   Clinical Course as of 12/02/21 0135  Wynelle Link Dec 02, 2021  0133 Patient has decided she does not want to have blood work or CT done. She thinks this is MSK pain based on having done some lifting a few days ago. She will follow up with her kidney doctor next week. Advised to RTED if her symptoms worsen or if she changes her mind about having additional tests done.  [CS]    Clinical Course User Index [CS] Pollyann Savoy, MD     MDM Rules/Calculators/A&P Medical Decision Making Problems Addressed: Right flank pain: acute illness or injury  Amount and/or Complexity of Data Reviewed Labs: ordered. Radiology: ordered.    Final Clinical Impression(s) / ED Diagnoses Final diagnoses:  Right flank pain    Rx / DC Orders ED Discharge Orders     None        Pollyann Savoy, MD 12/02/21 [phone]

## 2022-01-06 ENCOUNTER — Emergency Department (HOSPITAL_BASED_OUTPATIENT_CLINIC_OR_DEPARTMENT_OTHER)
Admission: EM | Admit: 2022-01-06 | Discharge: 2022-01-06 | Discharge status: Against Medical Advice | Payer: Self-pay | Attending: Emergency Medicine | Admitting: Emergency Medicine

## 2022-01-06 ENCOUNTER — Encounter (HOSPITAL_BASED_OUTPATIENT_CLINIC_OR_DEPARTMENT_OTHER): Payer: Self-pay | Admitting: Urology

## 2022-01-06 DIAGNOSIS — Z5321 Procedure and treatment not carried out due to patient leaving prior to being seen by health care provider: Secondary | ICD-10-CM | POA: Insufficient documentation

## 2022-01-06 DIAGNOSIS — I10 Essential (primary) hypertension: Secondary | ICD-10-CM | POA: Insufficient documentation

## 2022-01-06 NOTE — ED Notes (Signed)
Pt called for x 1 in lobby, per registration pt left

## 2022-01-06 NOTE — ED Triage Notes (Signed)
Pt states BP at home today was 160/80, Denies Blurry vision Reports very mild HA  Take BP medication

## 2022-02-17 ENCOUNTER — Emergency Department (HOSPITAL_BASED_OUTPATIENT_CLINIC_OR_DEPARTMENT_OTHER)
Admission: EM | Admit: 2022-02-17 | Discharge: 2022-02-17 | Disposition: A | Discharge status: Home / Self Care | Payer: Self-pay | Attending: Emergency Medicine | Admitting: Emergency Medicine

## 2022-02-17 ENCOUNTER — Other Ambulatory Visit: Payer: Self-pay

## 2022-02-17 ENCOUNTER — Encounter (HOSPITAL_BASED_OUTPATIENT_CLINIC_OR_DEPARTMENT_OTHER): Payer: Self-pay | Admitting: Emergency Medicine

## 2022-02-17 DIAGNOSIS — Z79899 Other long term (current) drug therapy: Secondary | ICD-10-CM | POA: Insufficient documentation

## 2022-02-17 DIAGNOSIS — Z7982 Long term (current) use of aspirin: Secondary | ICD-10-CM | POA: Insufficient documentation

## 2022-02-17 DIAGNOSIS — N3000 Acute cystitis without hematuria: Secondary | ICD-10-CM | POA: Insufficient documentation

## 2022-02-17 LAB — URINALYSIS, ROUTINE W REFLEX MICROSCOPIC
Glucose, UA: 100 mg/dL — AB
Ketones, ur: 15 mg/dL — AB
Nitrite: POSITIVE — AB
Protein, ur: 100 mg/dL — AB
Specific Gravity, Urine: 1.01 (ref 1.005–1.030)
pH: 5 (ref 5.0–8.0)

## 2022-02-17 LAB — URINALYSIS, MICROSCOPIC (REFLEX)

## 2022-02-17 LAB — PREGNANCY, URINE: Preg Test, Ur: NEGATIVE

## 2022-02-17 MED ORDER — CEPHALEXIN 500 MG PO CAPS: 500.0000 mg | ORAL_CAPSULE | Freq: Four times a day (QID) | ORAL | 0 refills | Status: AC

## 2022-02-17 NOTE — Discharge Instructions (Addendum)
Take the antibiotic Keflex as directed.  Would expect improvement over the next 2 to 3 days.  Return for fevers any new or worse symptoms.  Also urine sent for culture.  If the antibiotic Keflex is not working on the type of bacteria in your urine you will be contacted.

## 2022-02-17 NOTE — ED Notes (Signed)
Patient reports some dysuria. Denies any hematuria. Last bm this morning. Denies any nausea or vomiting.Abdominal area on rt side tender to touch

## 2022-02-17 NOTE — ED Triage Notes (Signed)
Pt arrives pov, c/o Rt flank pain with dysuria x 2 days. Denies fever

## 2022-02-17 NOTE — ED Provider Notes (Addendum)
[facility] Provider Note   CSN: [id number] Arrival date & time: 02/17/22  1725     History  Chief Complaint  Patient presents with   Flank Pain    Regina Thomas is a 51 y.o. female.  With complaint with burning with urination and some right CVA pain that started yesterday.  Patient denies any fevers nausea vomiting.  Reminds her of a urinary tract infection.  She has had trouble with frequent ones throughout her life.  Patient does not feel like it is a kidney stone.  Past medical history significant for hiatal hernia stroke TIA past surgical history significant for tonsillectomy appendectomy kidney surgery in 1973.  Patient is an everyday smoker.  Patient states that she has an allergy to penicillin but she can take amoxicillin without any difficulties and can take cephalosporins without any difficulty.       Home Medications Prior to Admission medications   Medication Sig Start Date End Date Taking? Authorizing Provider  cephALEXin (KEFLEX) 500 MG capsule Take 1 capsule (500 mg total) by mouth 4 (four) times daily. 02/17/22  Yes Fredia Sorrow, MD  amLODipine (NORVASC) 5 MG tablet Take 1 tablet by mouth daily. 08/15/20   [provider]  aspirin 81 MG tablet Take 81 mg by mouth daily.    [provider]  cephALEXin (KEFLEX) 500 MG capsule Take 2 capsules (1,000 mg total) by mouth 2 (two) times daily. 03/13/19   Charlesetta Shanks, MD  chlorhexidine (PERIDEX) 0.12 % solution Use as directed 15 mLs in the mouth or throat 2 (two) times daily. 11/09/19   Garald Balding, PA-C  cyclobenzaprine (FLEXERIL) 10 MG tablet Take 1 tablet (10 mg total) by mouth 3 (three) times daily as needed. 02/02/19   Rosemarie Ax, MD  esomeprazole (NEXIUM) 10 MG packet Take 10 mg by mouth daily before breakfast.    [provider]  ferrous sulfate 325 (65 FE) MG tablet Take by mouth.    [provider]  gabapentin (NEURONTIN) 100 MG capsule  Take 1 capsule (100 mg total) by mouth 3 (three) times daily. 02/02/19   Rosemarie Ax, MD  lidocaine (XYLOCAINE) 2 % solution Use as directed 15 mLs in the mouth or throat as needed for mouth pain. Swish and spit.  Do not swallow. 08/26/18   Langston Masker B, PA-C  metroNIDAZOLE (FLAGYL) 500 MG tablet Take 1 tablet (500 mg total) by mouth 2 (two) times daily. 11/07/19   Fawze, Mina A, PA-C  naproxen (NAPROSYN) 500 MG tablet Take 1 tablet (500 mg total) by mouth 2 (two) times daily as needed. [id number]   Delora Fuel, MD  predniSONE (DELTASONE) 5 MG tablet Take 6 pills for first day, 5 pills second day, 4 pills third day, 3 pills fourth day, 2 pills the fifth day, and 1 pill sixth day. 02/09/20   Rosemarie Ax, MD  traMADol (ULTRAM) 50 MG tablet Take 1 tablet (50 mg total) by mouth every 6 (six) hours as needed. 04/27/20   Veryl Speak, MD  triamcinolone cream (KENALOG) 0.1 % Apply 1 application topically 2 (two) times daily. 01/27/18   Kirichenko, Lahoma Rocker, PA-C  vitamin C (ASCORBIC ACID) 500 MG tablet Take 500 mg by mouth daily.    [provider]      Allergies    Codeine, Hydrocodone, Penicillins, Percocet [oxycodone-acetaminophen], and Zithromax [azithromycin]    Review of Systems   Review of Systems  Constitutional:  Negative for chills and fever.  HENT:  Negative for ear pain and sore throat.   Eyes:  Negative for pain and visual disturbance.  Respiratory:  Negative for cough and shortness of breath.   Cardiovascular:  Negative for chest pain and palpitations.  Gastrointestinal:  Negative for abdominal pain and vomiting.  Genitourinary:  Positive for dysuria. Negative for hematuria.  Musculoskeletal:  Positive for back pain. Negative for arthralgias.  Skin:  Negative for color change and rash.  Neurological:  Negative for seizures and syncope.  All other systems reviewed and are negative.   Physical Exam Updated Vital Signs BP 137/80 (BP Location: Right Arm)   Pulse 69    Temp 98.3 F (36.8 C) (Oral)   Resp 18   Ht 1.702 m (5\' 7" )   Wt 68 kg   LMP 10/24/2019   SpO2 99%   BMI 23.49 kg/m  Physical Exam Vitals and nursing note reviewed.  Constitutional:      General: She is not in acute distress.    Appearance: Normal appearance. She is well-developed. She is not ill-appearing.  HENT:     Head: Normocephalic and atraumatic.  Eyes:     Extraocular Movements: Extraocular movements intact.     Conjunctiva/sclera: Conjunctivae normal.     Pupils: Pupils are equal, round, and reactive to light.  Cardiovascular:     Rate and Rhythm: Normal rate and regular rhythm.     Heart sounds: No murmur heard. Pulmonary:     Effort: Pulmonary effort is normal. No respiratory distress.     Breath sounds: Normal breath sounds.  Abdominal:     Palpations: Abdomen is soft.     Tenderness: There is no abdominal tenderness.  Musculoskeletal:        General: No swelling.     Cervical back: Neck supple.  Skin:    General: Skin is warm and dry.     Capillary Refill: Capillary refill takes less than 2 seconds.  Neurological:     General: No focal deficit present.     Mental Status: She is alert and oriented to person, place, and time.  Psychiatric:        Mood and Affect: Mood normal.     ED Results / Procedures / Treatments   Labs (all labs ordered are listed, but only abnormal results are displayed) Labs Reviewed  URINALYSIS, ROUTINE W REFLEX MICROSCOPIC - Abnormal; Notable for the following components:      Result Value   Color, Urine AMBER (*)    Glucose, UA 100 (*)    Hgb urine dipstick TRACE (*)    Bilirubin Urine SMALL (*)    Ketones, ur 15 (*)    Protein, ur 100 (*)    Nitrite POSITIVE (*)    Leukocytes,Ua LARGE (*)    All other components within normal limits  URINALYSIS, MICROSCOPIC (REFLEX) - Abnormal; Notable for the following components:   Bacteria, UA MANY (*)    Non Squamous Epithelial PRESENT (*)    All other components within normal  limits  URINE CULTURE  PREGNANCY, URINE    EKG None  Radiology No results found.  Procedures Procedures    Medications Ordered in ED Medications - No data to display  ED Course/ Medical Decision Making/ A&P                           Medical Decision Making Amount and/or Complexity of Data Reviewed Labs: ordered.  Risk Prescription drug management.  Clinically a fairly commences urinary tract infection.  We will go ahead and check urinalysis.  If negative for urinary tract infection had always talk to patient that we may have to do CT renal just to make sure no kidney stone.  Patient clinically is fairly convinced it does not remind her of the kidney stone.  She has had them in the past.  Analysis seems to be consistent with urinary tract infection.  Pregnancy test negative.  Urine sent for culture.  Patient will be treated with Keflex.  She is able to take amoxicillin and she is able to take cephalosporins.  So this should not be an allergy concern.   Final Clinical Impression(s) / ED Diagnoses Final diagnoses:  Acute cystitis without hematuria    Rx / DC Orders ED Discharge Orders          Ordered    cephALEXin (KEFLEX) 500 MG capsule  4 times daily        02/17/22 1901              Vanetta Mulders, MD 02/17/22 Lavell Anchors, MD 02/17/22 [phone]

## 2022-02-19 LAB — URINE CULTURE: Culture: >=100000 — AB

## 2022-02-20 ENCOUNTER — Telehealth: Payer: Self-pay | Admitting: *Deleted

## 2022-02-20 NOTE — Telephone Encounter (Signed)
Post ED Visit - Positive Culture Follow-up  Culture report reviewed by antimicrobial stewardship pharmacist: [facility] [] Nathan Batchelder, Pharm.D. [] Jeremy Frens, Pharm.D., BCPS AQ-ID [] Mike Maccia, Pharm.D., BCPS [] Elizabeth Martin, Pharm.D., BCPS [] Minh Pham, Pharm.D., BCPS, AAHIVP [] Michelle Turner, Pharm.D., BCPS, AAHIVP [x] Rachel Rumbarger, PharmD, BCPS [] Thuy Dang, PharmD, BCPS [] Alison Masters, PharmD, BCPS [] Erin Deja, PharmD [] Catherine Pierce, PharmD, BCPS [] Benjamin Mancheril, PharmD  [facility] [] Michelle Bell, PharmD [] Jigna Gadhia, PharmD [] Nikola Glogovac, PharmD [] Terri Green, Rph [] Rachel (Ellen) Jackson, PharmD [] Justin Legge, PharmD [] Michelle Lilliston, PharmD [] Anh Pham, PharmD [] Leann Poindexter, PharmD [] Christine Shade, PharmD [] Mary Swayne, PharmD [] Erin Williamson, PharmD [] Drew Wofford, PharmD   Positive urine culture Treated with Cephalexin, organism sensitive to the same and no further patient follow-up is required at this time.  [name] 02/20/2022, 10:19 AM   

## 2022-10-22 ENCOUNTER — Encounter: Payer: Self-pay | Admitting: *Deleted

## 2022-11-25 ENCOUNTER — Ambulatory Visit: Payer: Self-pay | Admitting: Family Medicine

## 2022-11-27 ENCOUNTER — Ambulatory Visit: Payer: Self-pay | Admitting: Family Medicine

## 2022-11-30 ENCOUNTER — Other Ambulatory Visit: Payer: Self-pay

## 2022-11-30 ENCOUNTER — Emergency Department (HOSPITAL_BASED_OUTPATIENT_CLINIC_OR_DEPARTMENT_OTHER): Payer: Self-pay

## 2022-11-30 ENCOUNTER — Emergency Department (HOSPITAL_BASED_OUTPATIENT_CLINIC_OR_DEPARTMENT_OTHER)
Admission: EM | Admit: 2022-11-30 | Discharge: 2022-11-30 | Disposition: A | Discharge status: Home / Self Care | Payer: Self-pay | Attending: Emergency Medicine | Admitting: Emergency Medicine

## 2022-11-30 ENCOUNTER — Encounter (HOSPITAL_BASED_OUTPATIENT_CLINIC_OR_DEPARTMENT_OTHER): Payer: Self-pay | Admitting: Emergency Medicine

## 2022-11-30 DIAGNOSIS — M25561 Pain in right knee: Secondary | ICD-10-CM | POA: Insufficient documentation

## 2022-11-30 DIAGNOSIS — Z7982 Long term (current) use of aspirin: Secondary | ICD-10-CM | POA: Insufficient documentation

## 2022-11-30 NOTE — ED Triage Notes (Addendum)
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  Went to MD office today, they did not do xrays.  She states that she is having more pain.  She was walking and jerked her leg back earlier, which she states she dislocated.

## 2022-11-30 NOTE — ED Provider Notes (Signed)
[facility] Provider Note   CSN: [id number] Arrival date & time: 11/30/22  2228     History  Chief Complaint  Patient presents with   Knee Pain    Honi Zanardi is a 52 y.o. female.  The history is provided by the patient, a relative and medical records. No language interpreter was used.  Knee Pain    52 year old female significant history of recurrent right patellar dislocation presenting with complaints of right knee pain.  Patient report she may have dislocated her knee a week ago while walking out of [employer].  Since then she has been using her knee brace, and RICE therapy without adequate relief.  She went to an orthopedic urgent care center today for evaluation.  Patient was diagnosed with acute on chronic right knee pain with MCL sprain and additional possible patella subluxation.  Different type of therapy was discussed and patient discharged home with diclofenac.  Patient was also provided with a hinged knee brace.  Patient states her daughter is very concerned that she did not have an x-ray.  And voiced concerns about potential fracture of the that may have been missed thus prompting this ER visit.  Patient does have an orthopedist.  She denies any new numbness denies any hip or ankle pain.  She has been using a knee brace as well as the RICE therapy at home  Home Medications Prior to Admission medications   Medication Sig Start Date End Date Taking? Authorizing Provider  amLODipine (NORVASC) 5 MG tablet Take 1 tablet by mouth daily. 08/15/20   [provider]  aspirin 81 MG tablet Take 81 mg by mouth daily.    [provider]  cephALEXin (KEFLEX) 500 MG capsule Take 2 capsules (1,000 mg total) by mouth 2 (two) times daily. 03/13/19   Arby Barrette, MD  cephALEXin (KEFLEX) 500 MG capsule Take 1 capsule (500 mg total) by mouth 4 (four) times daily. 02/17/22   Vanetta Mulders, MD  chlorhexidine (PERIDEX) 0.12 %  solution Use as directed 15 mLs in the mouth or throat 2 (two) times daily. 11/09/19   Mare Ferrari, PA-C  cyclobenzaprine (FLEXERIL) 10 MG tablet Take 1 tablet (10 mg total) by mouth 3 (three) times daily as needed. 02/02/19   Myra Rude, MD  esomeprazole (NEXIUM) 10 MG packet Take 10 mg by mouth daily before breakfast.    [provider]  ferrous sulfate 325 (65 FE) MG tablet Take by mouth.    [provider]  gabapentin (NEURONTIN) 100 MG capsule Take 1 capsule (100 mg total) by mouth 3 (three) times daily. 02/02/19   Myra Rude, MD  lidocaine (XYLOCAINE) 2 % solution Use as directed 15 mLs in the mouth or throat as needed for mouth pain. Swish and spit.  Do not swallow. 08/26/18   Aviva Kluver B, PA-C  metroNIDAZOLE (FLAGYL) 500 MG tablet Take 1 tablet (500 mg total) by mouth 2 (two) times daily. 11/07/19   Fawze, Mina A, PA-C  naproxen (NAPROSYN) 500 MG tablet Take 1 tablet (500 mg total) by mouth 2 (two) times daily as needed. 11/25/18   Dione Booze, MD  predniSONE (DELTASONE) 5 MG tablet Take 6 pills for first day, 5 pills second day, 4 pills third day, 3 pills fourth day, 2 pills the fifth day, and 1 pill sixth day. 02/09/20   Myra Rude, MD  traMADol (ULTRAM) 50 MG tablet Take 1 tablet (50 mg total)  by mouth every 6 (six) hours as needed. 04/27/20   Geoffery Lyons, MD  triamcinolone cream (KENALOG) 0.1 % Apply 1 application topically 2 (two) times daily. 01/27/18   Kirichenko, Lemont Fillers, PA-C  vitamin C (ASCORBIC ACID) 500 MG tablet Take 500 mg by mouth daily.    [provider]      Allergies    Codeine, Hydrocodone, Penicillins, Percocet [oxycodone-acetaminophen], and Zithromax [azithromycin]    Review of Systems   Review of Systems  All other systems reviewed and are negative.   Physical Exam Updated Vital Signs BP (!) 185/89   Pulse 83   Temp 98.4 F (36.9 C) (Oral)   Resp 17   LMP 10/24/2019   SpO2 100%  Physical Exam Vitals and  nursing note reviewed.  Constitutional:      General: She is not in acute distress.    Appearance: She is well-developed.  HENT:     Head: Atraumatic.  Eyes:     Conjunctiva/sclera: Conjunctivae normal.  Pulmonary:     Effort: Pulmonary effort is normal.  Musculoskeletal:        General: Tenderness (Right knee: Tenderness to medial joint line on palpation decreased knee flexion and extension secondary to pain but no obvious deformity noted.  Patella is located.  No edema or warmth.) present.     Cervical back: Neck supple.     Comments: Right hip and right ankle nontender.  Skin:    Findings: No rash.  Neurological:     Mental Status: She is alert.  Psychiatric:        Mood and Affect: Mood normal.     ED Results / Procedures / Treatments   Labs (all labs ordered are listed, but only abnormal results are displayed) Labs Reviewed - No data to display  EKG None  Radiology No results found.  Procedures Procedures    Medications Ordered in ED Medications - No data to display  ED Course/ Medical Decision Making/ A&P                             Medical Decision Making  BP (!) 185/89   Pulse 83   Temp 98.4 F (36.9 C) (Oral)   Resp 17   LMP 10/24/2019   SpO2 100%   11:6 PM  52 year old female significant history of recurrent right patellar dislocation presenting with complaints of right knee pain.  Patient report she may have dislocated her knee a week ago while walking out of [employer].  Since then she has been using her knee brace, and RICE therapy without adequate relief.  She went to an orthopedic urgent care center today for evaluation.  Patient was diagnosed with acute on chronic right knee pain with MCL sprain and additional possible patella subluxation.  Different type of therapy was discussed and patient discharged home with diclofenac.  Patient was also provided with a hinged knee brace.  Patient states her daughter is very concerned that she did not have  an x-ray.  And voiced concerns about potential fracture of the that may have been missed thus prompting this ER visit.  Patient does have an orthopedist.  She denies any new numbness denies any hip or ankle pain.  She has been using a knee brace as well as the RICE therapy at home  On exam patient has tenderness to the medial joint line of the right knee with decreased flexion and extension secondary to pain.  Patella is located no obvious deformity noted no signs of infection noted.  DDx: Strain, sprain, dislocation/relocation, meniscal tear, cellulitis, septic joint, gout  EMR reviewed patient has been seen and evaluated for her symptoms recently.  X-ray of her right knee was obtained by me independently viewed interpreted by me in which I agree with radiology interpretation.  Fortunately x-ray did not show any concerning finding.  I discussed the signs with patient, I gave patient reassurance.  Will provide knee immobilizer and crutches for additional support and patient will follow-up with orthopedic specialist for further care.  Patient is noted to have elevated blood pressure, recommend blood pressure rechecked by her PCP at her earliest convenience.  Social determinant health including tobacco use.        Final Clinical Impression(s) / ED Diagnoses Final diagnoses:  Acute pain of right knee    Rx / DC Orders ED Discharge Orders     None         Fayrene Helper, PA-C 11/30/22 2329    Molpus, Jonny Ruiz, MD 12/01/22 0045

## 2022-11-30 NOTE — Discharge Instructions (Signed)
Please wear knee immobilizer and use crutches as needed for support of your knee pain.  Fortunately x-ray today did not show any concerning finding.  Follow-up closely with your orthopedic specialist for outpatient evaluation.

## 2022-12-03 ENCOUNTER — Ambulatory Visit (INDEPENDENT_AMBULATORY_CARE_PROVIDER_SITE_OTHER): Payer: Self-pay | Admitting: Family Medicine

## 2022-12-03 ENCOUNTER — Encounter: Payer: Self-pay | Admitting: Family Medicine

## 2022-12-03 VITALS — BP 118/80 | Ht 67.0 in | Wt 136.0 lb

## 2022-12-03 DIAGNOSIS — M25561 Pain in right knee: Secondary | ICD-10-CM

## 2022-12-03 NOTE — Patient Instructions (Addendum)
We will go ahead with an MRI of your knee. You sprained your MCL and I'm concerned you have a flap tear of your meniscus. Given the severe instability of the knee we will do the MRI and I'll call you with the results. Continue using the brace when up and walking around. Use crutches to help with stability if needed. Take diclofenac twice a day with food for 5-7 days then as needed. Ok to take tylenol with this. Icing 15 minutes at a time 3-4 times a day.

## 2022-12-03 NOTE — Progress Notes (Signed)
PCP: Redmond School, NP  Subjective:   HPI: Patient is a 52 y.o. female here for right knee pain.  Patient has history of patellar subluxation/dislocation dating back to when she was 68-59 years old. She can feel when knee is about to sublux - had episode at [facility] recently about 1.5-2 weeks ago when this happened - she jerked knee back laterally and felt pain medial knee. No swellling or bruising. Radiographs were negative. Placed in an immobilizer but she switched recently to a hinged knee brace. Has crutches but not using. Knee feels very unstable, noted catching laterally also.  Past Medical History:  Diagnosis Date   Hiatal hernia    Stroke [facility])    TIA (transient ischemic attack)     Current Outpatient Medications on File Prior to Visit  Medication Sig Dispense Refill   amLODipine (NORVASC) 5 MG tablet Take 1 tablet by mouth daily.     aspirin 81 MG tablet Take 81 mg by mouth daily.     cephALEXin (KEFLEX) 500 MG capsule Take 2 capsules (1,000 mg total) by mouth 2 (two) times daily. 28 capsule 0   cephALEXin (KEFLEX) 500 MG capsule Take 1 capsule (500 mg total) by mouth 4 (four) times daily. 28 capsule 0   chlorhexidine (PERIDEX) 0.12 % solution Use as directed 15 mLs in the mouth or throat 2 (two) times daily. 120 mL 0   cyclobenzaprine (FLEXERIL) 10 MG tablet Take 1 tablet (10 mg total) by mouth 3 (three) times daily as needed. 30 tablet 0   esomeprazole (NEXIUM) 10 MG packet Take 10 mg by mouth daily before breakfast.     ferrous sulfate 325 (65 FE) MG tablet Take by mouth.     gabapentin (NEURONTIN) 100 MG capsule Take 1 capsule (100 mg total) by mouth 3 (three) times daily. 90 capsule 0   lidocaine (XYLOCAINE) 2 % solution Use as directed 15 mLs in the mouth or throat as needed for mouth pain. Swish and spit.  Do not swallow. 100 mL 0   metroNIDAZOLE (FLAGYL) 500 MG tablet Take 1 tablet (500 mg total) by mouth 2 (two) times daily. 14 tablet 0   naproxen (NAPROSYN)  500 MG tablet Take 1 tablet (500 mg total) by mouth 2 (two) times daily as needed. 30 tablet 0   predniSONE (DELTASONE) 5 MG tablet Take 6 pills for first day, 5 pills second day, 4 pills third day, 3 pills fourth day, 2 pills the fifth day, and 1 pill sixth day. 21 tablet 0   traMADol (ULTRAM) 50 MG tablet Take 1 tablet (50 mg total) by mouth every 6 (six) hours as needed. 15 tablet 0   triamcinolone cream (KENALOG) 0.1 % Apply 1 application topically 2 (two) times daily. 30 g 0   vitamin C (ASCORBIC ACID) 500 MG tablet Take 500 mg by mouth daily.     No current facility-administered medications on file prior to visit.    Past Surgical History:  Procedure Laterality Date   APPENDECTOMY  1973   CESAREAN SECTION  1993, 61   INNER EAR SURGERY     tubes    KIDNEY SURGERY  1973   TONSILLECTOMY  1976    Allergies  Allergen Reactions   Codeine Nausea And Vomiting   Hydrocodone Nausea And Vomiting   Penicillins    Percocet [Oxycodone-Acetaminophen] Nausea And Vomiting   Zithromax [Azithromycin] Palpitations    zpack    BP 118/80 (BP Location: Left Arm, Patient Position: Sitting)  Ht 5\' 7"  (1.702 m)   Wt 136 lb (61.7 kg)   LMP 10/24/2019   BMI 21.30 kg/m       No data to display              No data to display              Objective:  Physical Exam:  Gen: NAD, comfortable in exam room  Right knee: No gross deformity, ecchymoses.  Minimal effusion. TTP lateral joint line and over LCL.  No post patellar facet, other tenderness. ROM 0 - 90 degrees.  5/5 strength with extension. Negative ant/post drawers. Pain without laxity on varus testing.  Negative valgus testing. Negative lachman.  Positive mcmurrays, apleys.  Positive apprehension. NV intact distally.   Assessment & Plan:  1. Right knee injury - occurred about 1.5 weeks ago.  Radiographs negative for bony abnormalities.  Has history of patellar subluxation/dislocations though location of current pain and  exam consistent with LCL sprain and probable lateral meniscus tear with flap component.  Given severe instability and catching will proceed with MRI.  Continue knee brace.  Crutches to help with getting around.  Diclofenac, tylenol, icing.

## 2022-12-09 ENCOUNTER — Ambulatory Visit: Payer: Self-pay | Admitting: Family Medicine

## 2022-12-10 ENCOUNTER — Ambulatory Visit: Payer: Self-pay | Admitting: Family Medicine

## 2022-12-12 ENCOUNTER — Ambulatory Visit: Payer: Self-pay | Admitting: Sports Medicine

## 2022-12-15 ENCOUNTER — Ambulatory Visit (HOSPITAL_BASED_OUTPATIENT_CLINIC_OR_DEPARTMENT_OTHER)
Admission: RE | Admit: 2022-12-15 | Discharge: 2022-12-15 | Disposition: A | Discharge status: Home / Self Care | Payer: Self-pay | Source: Ambulatory Visit | Attending: Family Medicine | Admitting: Family Medicine

## 2022-12-15 DIAGNOSIS — M25561 Pain in right knee: Secondary | ICD-10-CM

## 2022-12-22 ENCOUNTER — Ambulatory Visit (HOSPITAL_BASED_OUTPATIENT_CLINIC_OR_DEPARTMENT_OTHER): Payer: Self-pay

## 2022-12-29 ENCOUNTER — Ambulatory Visit (HOSPITAL_BASED_OUTPATIENT_CLINIC_OR_DEPARTMENT_OTHER): Payer: Self-pay

## 2023-01-05 ENCOUNTER — Ambulatory Visit (HOSPITAL_BASED_OUTPATIENT_CLINIC_OR_DEPARTMENT_OTHER): Payer: Self-pay | Attending: Family Medicine

## 2023-02-26 ENCOUNTER — Ambulatory Visit (HOSPITAL_BASED_OUTPATIENT_CLINIC_OR_DEPARTMENT_OTHER)
Admission: RE | Admit: 2023-02-26 | Discharge: 2023-02-26 | Disposition: A | Discharge status: Home / Self Care | Payer: Self-pay | Source: Ambulatory Visit | Attending: Sports Medicine | Admitting: Sports Medicine

## 2023-02-26 ENCOUNTER — Telehealth (HOSPITAL_BASED_OUTPATIENT_CLINIC_OR_DEPARTMENT_OTHER): Payer: Self-pay

## 2023-02-26 ENCOUNTER — Encounter: Payer: Self-pay | Admitting: Sports Medicine

## 2023-02-26 ENCOUNTER — Ambulatory Visit (INDEPENDENT_AMBULATORY_CARE_PROVIDER_SITE_OTHER): Payer: Self-pay | Admitting: Sports Medicine

## 2023-02-26 VITALS — BP 130/80 | Ht 67.0 in | Wt 136.0 lb

## 2023-02-26 DIAGNOSIS — M25561 Pain in right knee: Secondary | ICD-10-CM

## 2023-02-26 DIAGNOSIS — M79604 Pain in right leg: Secondary | ICD-10-CM | POA: Insufficient documentation

## 2023-02-26 NOTE — Progress Notes (Signed)
   Subjective:    Patient ID: Regina Thomas, female    DOB: [birth year], 52 y.o.   MRN: [id number]  HPI  Regina Thomas presents today for follow-up on right knee pain.  She was last seen by Dr. Pearletha Forge on May 28.  She has a history of patellar subluxation/dislocation.  She had reinjured the knee a couple weeks prior to that office visit.  Dr. Pearletha Forge had ordered an MRI of the right knee but the patient was unable to proceed due to some sort of malfunction of the machine.  Therefore, she has not yet had her MRI.  She endorses painful popping and catching in the knee.  Feelings of instability as well.  She has been wearing a double upright brace for support.  Her knee pain was made worse recently after her 98-year-old granddaughter kicked her in the lower leg.  She has had some pain in the lower leg since then.  She is here today with her daughter.   Review of Systems As above    Objective:   Physical Exam  Right knee: Moderate quad atrophy.  No effusion.  Positive patellar apprehension.  The rest of the knee exam is limited by pain.  No gross instability on ligamentous testing.  There is some slight tenderness to palpation diffusely along the mid to distal tibia but no ecchymosis.  No soft tissue swelling.  X-rays of the right knee dated Nov 30, 2022 showed no evidence of fracture or dislocation.  No joint effusion.  No significant degenerative changes.  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  I will follow-up with her via MyChart with those results when available.  In the meantime, we will replace her double upright  brace with a patellar lateral stabilizer brace.  This note was dictated using Dragon naturally speaking software and may contain errors in syntax, spelling, or content which have not been identified prior to signing this note.

## 2023-03-11 ENCOUNTER — Encounter: Payer: Self-pay | Admitting: *Deleted

## 2023-03-16 ENCOUNTER — Encounter (HOSPITAL_BASED_OUTPATIENT_CLINIC_OR_DEPARTMENT_OTHER): Payer: Self-pay

## 2023-03-16 ENCOUNTER — Ambulatory Visit (HOSPITAL_BASED_OUTPATIENT_CLINIC_OR_DEPARTMENT_OTHER)
Admission: RE | Admit: 2023-03-16 | Discharge: 2023-03-16 | Disposition: A | Discharge status: Home / Self Care | Payer: Self-pay | Source: Ambulatory Visit | Attending: Sports Medicine | Admitting: Sports Medicine

## 2023-03-16 DIAGNOSIS — M25561 Pain in right knee: Secondary | ICD-10-CM

## 2023-03-16 DIAGNOSIS — M79604 Pain in right leg: Secondary | ICD-10-CM

## 2023-03-20 NOTE — Addendum Note (Signed)
Addended by: Merrilyn Puma on: 03/20/2023 12:14 PM   Modules accepted: Orders

## 2023-03-30 ENCOUNTER — Ambulatory Visit (HOSPITAL_COMMUNITY)
Admission: RE | Admit: 2023-03-30 | Discharge: 2023-03-30 | Disposition: A | Discharge status: Home / Self Care | Payer: Self-pay | Source: Ambulatory Visit | Attending: Sports Medicine | Admitting: Sports Medicine

## 2023-03-30 DIAGNOSIS — M25561 Pain in right knee: Secondary | ICD-10-CM | POA: Insufficient documentation

## 2023-03-30 DIAGNOSIS — M79604 Pain in right leg: Secondary | ICD-10-CM | POA: Insufficient documentation

## 2023-04-09 ENCOUNTER — Encounter: Payer: Self-pay | Admitting: Sports Medicine

## 2023-04-09 ENCOUNTER — Other Ambulatory Visit: Payer: Self-pay | Admitting: *Deleted

## 2023-04-09 DIAGNOSIS — M25561 Pain in right knee: Secondary | ICD-10-CM

## 2023-04-10 ENCOUNTER — Telehealth: Payer: Self-pay | Admitting: *Deleted

## 2023-04-10 NOTE — Telephone Encounter (Signed)
Referral placed.

## 2023-04-10 NOTE — Telephone Encounter (Signed)
-----   Message from Langley Adie sent at 04/09/2023  1:46 PM EDT ----- Regarding: Patient says wants Referral to Ortho Spec/Dr.Gregory Dean Pt cb states Dr.Draper says he can refer her to Dr.Dean for Rt leg pain.Marland Kitchen. (see pt's chart for msg communication)

## 2023-04-15 ENCOUNTER — Other Ambulatory Visit: Payer: Self-pay | Admitting: *Deleted

## 2023-04-15 NOTE — Addendum Note (Signed)
Addended by: Annita Brod on: 04/15/2023 02:34 PM   Modules accepted: Orders

## 2023-04-25 ENCOUNTER — Ambulatory Visit: Payer: Self-pay | Admitting: Surgical

## 2023-05-07 ENCOUNTER — Ambulatory Visit: Payer: Self-pay | Admitting: Surgical

## 2023-07-17 ENCOUNTER — Encounter: Payer: Self-pay | Admitting: Urology

## 2023-07-17 ENCOUNTER — Ambulatory Visit (INDEPENDENT_AMBULATORY_CARE_PROVIDER_SITE_OTHER): Payer: Self-pay | Admitting: Urology

## 2023-07-17 VITALS — BP 137/71 | HR 82 | Ht 67.0 in | Wt 140.0 lb

## 2023-07-17 DIAGNOSIS — R399 Unspecified symptoms and signs involving the genitourinary system: Secondary | ICD-10-CM

## 2023-07-17 DIAGNOSIS — R3129 Other microscopic hematuria: Secondary | ICD-10-CM

## 2023-07-17 DIAGNOSIS — R109 Unspecified abdominal pain: Secondary | ICD-10-CM

## 2023-07-17 MED ORDER — SOLIFENACIN SUCCINATE 10 MG PO TABS: 10.0000 mg | ORAL_TABLET | Freq: Every day | ORAL | 11 refills | Status: AC

## 2023-07-17 NOTE — Progress Notes (Signed)
 Assessment: 1. Lower urinary tract symptoms (LUTS)   2. Flank pain   3. Microscopic hematuria      Plan: Today had a long discussion with the patient regarding her urologic issues.  I have recommended a renogram to assess whether there is any obstruction of the right kidney given her prior pyeloplasty and right flank pain  Discussed medical management with [facility]  for her lower urinary tract symptoms most consistent with OAB. Rx: solfenacin 10 mg daily  Also discussed bowel regimen with daily stool softener and as needed MiraLAX  Patient has had a CT with contrast within the past year.  Will perform female exam and cystoscopy on follow-up given her microscopic hematuria.  Chief Complaint: LUTS  History of Present Illness:  Regina Thomas is a 53 y.o. female wtih past medical history significant for history of TIA who is seen today as a new patient with complaints of lower urinary tract symptoms as well as intermittent right sided flank pain.  Patient has a history of prior pyeloplasty at age 59 she also reports a history of recurrent UTIs and kidney infections throughout her childhood.  Over the past year she has had multiple urgent care visits with intermittent right sided flank pain as well as lower urinary tract symptoms including frequency urgency and occasional urge incontinence.  She is not aware of ever being treated with an OAB type medicine.  She has been treated empirically for UTIs on numerous occasions without much documentation culture wise.  On review of her outside records and labs she has normal renal function but has had several urinalyses with documented significant microscopic hematuria generally in the 3-8 RBC/hpf range.  Today she has 0-2 RBC.  Patient has also had multiple imaging studies over the past year.  She was actually seen by urology at [facility] early last year after having had an ultrasound that showed moderate right-sided hydro.  She had a detailed CT with  with and without contrast that subsequently showed that she had some mild right sided pelvic caliectasis as well as an extrarenal pelvis.  There was no delayed function.  Recent ultrasound recent ultrasound done at urgent care November 2024 also showed  Moderate right hydro.  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  This is likely the result of chronic changes status post prior pyeloplasty.  There is no history of kidney stones  H/o ovarian cyst in 1997. No other GI / GU. Abdominal sx = c section x 2 and laparoscopy for ovarian cyst Right kidney surgery at age 30 with recurrent UTI / kidney infection. Does not have urologist or nephrologist who she follows with. Tobacco use about 1/3 ppd. She also drinks a lot of coffee and sweet tea. She has minimal water intake.   Past Medical History:  Past Medical History:  Diagnosis Date   Hiatal hernia    Stroke (HCC)    TIA (transient ischemic attack)     Past Surgical History:  Past Surgical History:  Procedure Laterality Date   APPENDECTOMY  1973   CESAREAN SECTION  89, 28   INNER EAR SURGERY     tubes    KIDNEY SURGERY  1973   TONSILLECTOMY  1976    Allergies:  Allergies  Allergen Reactions   Codeine Nausea And Vomiting   Hydrocodone Nausea And Vomiting   Penicillins    Percocet [Oxycodone -Acetaminophen ] Nausea And Vomiting   Zithromax [Azithromycin] Palpitations  zpack    Family History:  Family History  Problem Relation Age of Onset   Hypertension Mother    Diabetes Mother    Hypertension Father    Diabetes Father    Cancer Father        melanoma   Cancer Maternal Grandmother        lung   Heart attack Maternal Grandfather    Stroke Maternal Grandfather     Social History:  Social History   Tobacco Use   Smoking status: Every Day    Current packs/day: 0.50    Average packs/day: 0.5 packs/day for 15.0 years (7.5 ttl pk-yrs)    Types: Cigarettes    Smokeless tobacco: Never  Vaping Use   Vaping status: Never Used  Substance Use Topics   Alcohol use: No   Drug use: No    Review of symptoms:  Constitutional:  Negative for unexplained weight loss, night sweats, fever, chills ENT:  Negative for nose bleeds, sinus pain, painful swallowing CV:  Negative for chest pain, shortness of breath, exercise intolerance, palpitations, loss of consciousness Resp:  Negative for cough, wheezing, shortness of breath GI:  Negative for nausea, vomiting, diarrhea, bloody stools GU:  Positives noted in HPI; otherwise negative for gross hematuria, dysuria, urinary incontinence Neuro:  Negative for seizures, poor balance, limb weakness, slurred speech Psych:  Negative for lack of energy, depression, anxiety Endocrine:  Negative for polydipsia, polyuria, symptoms of hypoglycemia (dizziness, hunger, sweating) Hematologic:  Negative for anemia, purpura, petechia, prolonged or excessive bleeding, use of anticoagulants  Allergic:  Negative for difficulty breathing or choking as a result of exposure to anything; no shellfish allergy; no allergic response (rash/itch) to materials, foods  Physical exam: BP 137/71   Pulse 82   Ht 5' 7 (1.702 m)   Wt 140 lb (63.5 kg)   LMP 10/24/2019   BMI 21.93 kg/m  GENERAL APPEARANCE:  Well appearing, well developed, well nourished, NAD   Results: No results found for this or any previous visit (from the past 24 hours).

## 2023-07-18 LAB — URINALYSIS, ROUTINE W REFLEX MICROSCOPIC
Bilirubin, UA: NEGATIVE
Glucose, UA: NEGATIVE
Ketones, UA: NEGATIVE
Nitrite, UA: NEGATIVE
Protein,UA: NEGATIVE
Specific Gravity, UA: 1.01 (ref 1.005–1.030)
Urobilinogen, Ur: 0.2 mg/dL (ref 0.2–1.0)
pH, UA: 6.5 (ref 5.0–7.5)

## 2023-07-18 LAB — MICROSCOPIC EXAMINATION: Epithelial Cells (non renal): >10 /[HPF] — AB (ref 0–10)

## 2023-07-21 ENCOUNTER — Encounter (HOSPITAL_COMMUNITY): Payer: Self-pay

## 2023-08-12 ENCOUNTER — Ambulatory Visit: Payer: Self-pay | Admitting: Urology

## 2023-08-19 ENCOUNTER — Other Ambulatory Visit: Payer: Self-pay | Admitting: Urology

## 2023-08-19 DIAGNOSIS — R109 Unspecified abdominal pain: Secondary | ICD-10-CM

## 2023-08-20 ENCOUNTER — Other Ambulatory Visit: Payer: Self-pay | Admitting: Urology

## 2023-08-25 ENCOUNTER — Encounter (HOSPITAL_COMMUNITY): Admission: RE | Admit: 2023-08-25 | Payer: Self-pay | Source: Ambulatory Visit

## 2023-08-25 ENCOUNTER — Other Ambulatory Visit (HOSPITAL_COMMUNITY): Payer: Self-pay

## 2023-08-28 ENCOUNTER — Other Ambulatory Visit: Payer: Self-pay | Admitting: Urology

## 2023-11-28 ENCOUNTER — Emergency Department (HOSPITAL_BASED_OUTPATIENT_CLINIC_OR_DEPARTMENT_OTHER)
Admission: EM | Admit: 2023-11-28 | Discharge: 2023-11-28 | Disposition: A | Discharge status: Home / Self Care | Payer: Self-pay

## 2024-06-20 ENCOUNTER — Emergency Department (HOSPITAL_BASED_OUTPATIENT_CLINIC_OR_DEPARTMENT_OTHER): Payer: Self-pay

## 2024-06-20 ENCOUNTER — Encounter (HOSPITAL_BASED_OUTPATIENT_CLINIC_OR_DEPARTMENT_OTHER): Payer: Self-pay | Admitting: Emergency Medicine

## 2024-06-20 ENCOUNTER — Emergency Department (HOSPITAL_BASED_OUTPATIENT_CLINIC_OR_DEPARTMENT_OTHER)
Admission: EM | Admit: 2024-06-20 | Discharge: 2024-06-20 | Disposition: A | Discharge status: Home / Self Care | Payer: Self-pay | Attending: Emergency Medicine | Admitting: Emergency Medicine

## 2024-06-20 DIAGNOSIS — Z7982 Long term (current) use of aspirin: Secondary | ICD-10-CM | POA: Insufficient documentation

## 2024-06-20 DIAGNOSIS — M7989 Other specified soft tissue disorders: Secondary | ICD-10-CM | POA: Insufficient documentation

## 2024-06-20 DIAGNOSIS — W228XXA Striking against or struck by other objects, initial encounter: Secondary | ICD-10-CM | POA: Insufficient documentation

## 2024-06-20 DIAGNOSIS — Z720 Tobacco use: Secondary | ICD-10-CM | POA: Insufficient documentation

## 2024-06-20 DIAGNOSIS — M25561 Pain in right knee: Secondary | ICD-10-CM | POA: Insufficient documentation

## 2024-06-20 NOTE — ED Provider Notes (Signed)
 [facility] Provider Note   CSN: [id number] Arrival date & time: 06/20/24  1331     Patient presents with: Knee Pain   Regina Thomas is a 53 y.o. female.  She is here with a complaint o right knee pain since yesterday.  She said she twisted it and struck it on the car door getting out of the car.  Since then its been swollen and very painful.  Feels unstable when she walks.  No other injuries or complaints.  No numbness or weakness.   The history is provided by the patient.  Knee Pain Location:  Knee Time since incident:  2 days Injury: yes   Knee location:  L knee Pain details:    Quality:  Throbbing Chronicity:  New Relieved by:  Nothing Worsened by:  Activity Associated symptoms: swelling   Associated symptoms: no muscle weakness, no numbness and no tingling        Prior to Admission medications  Medication Sig Start Date End Date Taking? Authorizing Provider  amLODipine (NORVASC) 5 MG tablet Take 1 tablet by mouth daily. 08/15/20   [provider]  aspirin  81 MG tablet Take 81 mg by mouth daily. Patient not taking: Reported on 07/17/2023    [provider]  cephALEXin  (KEFLEX ) 500 MG capsule Take 2 capsules (1,000 mg total) by mouth 2 (two) times daily. Patient not taking: Reported on 07/17/2023 03/13/19   Armenta Canning, MD  cephALEXin  (KEFLEX ) 500 MG capsule Take 1 capsule (500 mg total) by mouth 4 (four) times daily. Patient not taking: Reported on 07/17/2023 02/17/22   Zackowski, Scott, MD  chlorhexidine  (PERIDEX ) 0.12 % solution Use as directed 15 mLs in the mouth or throat 2 (two) times daily. Patient not taking: Reported on 07/17/2023 11/09/19   Vonn Hadassah LABOR, PA-C  cyclobenzaprine  (FLEXERIL ) 10 MG tablet Take 1 tablet (10 mg total) by mouth 3 (three) times daily as needed. Patient not taking: Reported on 07/17/2023 02/02/19   Chick Venetia BRAVO, MD  esomeprazole (NEXIUM) 10 MG packet Take 10 mg by mouth daily  before breakfast.    [provider]  ferrous sulfate 325 (65 FE) MG tablet Take by mouth.    [provider]  gabapentin  (NEURONTIN ) 100 MG capsule Take 1 capsule (100 mg total) by mouth 3 (three) times daily. Patient not taking: Reported on 07/17/2023 02/02/19   Chick Venetia BRAVO, MD  lidocaine  (XYLOCAINE ) 2 % solution Use as directed 15 mLs in the mouth or throat as needed for mouth pain. Swish and spit.  Do not swallow. Patient not taking: Reported on 07/17/2023 08/26/18   Murray, Alyssa B, PA-C  metroNIDAZOLE  (FLAGYL ) 500 MG tablet Take 1 tablet (500 mg total) by mouth 2 (two) times daily. Patient not taking: Reported on 07/17/2023 11/07/19   Fawze, Mina A, PA-C  naproxen  (NAPROSYN ) 500 MG tablet Take 1 tablet (500 mg total) by mouth 2 (two) times daily as needed. Patient not taking: Reported on 07/17/2023 11/25/18   Raford Lenis, MD  predniSONE  (DELTASONE ) 5 MG tablet Take 6 pills for first day, 5 pills second day, 4 pills third day, 3 pills fourth day, 2 pills the fifth day, and 1 pill sixth day. Patient not taking: Reported on 07/17/2023 02/09/20   Chick Venetia BRAVO, MD  solifenacin  (VESICARE ) 10 MG tablet Take 1 tablet (10 mg total) by mouth daily. 07/17/23   Shona Layman BROCKS, MD  traMADol  (ULTRAM ) 50 MG tablet Take 1 tablet (50  mg total) by mouth every 6 (six) hours as needed. Patient not taking: Reported on 07/17/2023 04/27/20   Geroldine Berg, MD  triamcinolone  cream (KENALOG ) 0.1 % Apply 1 application topically 2 (two) times daily. Patient not taking: Reported on 07/17/2023 01/27/18   Kirichenko, Tatyana, PA-C  vitamin C (ASCORBIC ACID) 500 MG tablet Take 500 mg by mouth daily. Patient not taking: Reported on 07/17/2023    [provider]    Allergies: Codeine, Hydrocodone, Penicillins, Percocet [oxycodone -acetaminophen ], and Zithromax [azithromycin]    Review of Systems  Updated Vital Signs BP (!) 141/89 (BP Location: Right Arm)   Pulse 71   Temp 98.7 F (37.1 C) (Oral)    Resp 16   Ht 5' 7 (1.702 m)   Wt 65.8 kg   LMP 10/24/2019   SpO2 99%   BMI 22.71 kg/m   Physical Exam Constitutional:      Appearance: Normal appearance. She is well-developed.  HENT:     Head: Normocephalic and atraumatic.  Eyes:     Conjunctiva/sclera: Conjunctivae normal.  Musculoskeletal:        General: Swelling and tenderness present. No deformity.     Cervical back: Neck supple.     Comments: right knee tender along medial joint line.  Mild patellar tenderness.  No significant effusion.  Extensor mechanism is intact.  No anterior drawer.  Distal neurovascularly intact.  Skin:    General: Skin is warm and dry.  Neurological:     General: No focal deficit present.     Mental Status: She is alert.     GCS: GCS eye subscore is 4. GCS verbal subscore is 5. GCS motor subscore is 6.     Sensory: No sensory deficit.     Motor: No weakness.     (all labs ordered are listed, but only abnormal results are displayed) Labs Reviewed - No data to display  EKG: None  Radiology: DG Knee Complete 4 Views Right Result Date: 06/20/2024 CLINICAL DATA:  Right knee pain for 2 days.  Hit knee on car door. EXAM: RIGHT KNEE - COMPLETE 4+ VIEW COMPARISON:  11/30/2022. FINDINGS: No evidence of acute fracture or dislocation. Mild tricompartmental degenerative changes are present. There is a trace joint effusion. Soft tissues are unremarkable. IMPRESSION: No acute fracture or dislocation. Electronically Signed   By: Leita Birmingham M.D.   On: 06/20/2024 14:19     Procedures   Medications Ordered in the ED - No data to display                                  Medical Decision Making Amount and/or Complexity of Data Reviewed Radiology: ordered.   This patient complains of right knee swelling and pain; this involves an extensive number of treatment Options and is a complaint that carries with it a high risk of complications and morbidity. The differential includes contusion, fracture,  dislocation, sprain, meniscal injury, ligamentous injury I ordered imaging studies which included x-ray knee and I independently    visualized and interpreted imaging which showed no acute findings Additional history obtained from patient's companion Previous records obtained and reviewed in epic no recent admissions Social determinants considered, tobacco use Critical Interventions: None  After the interventions stated above, I reevaluated the patient and found patient to be otherwise well-appearing Admission and further testing considered, she is declining knee immobilizer as she says she needs to work and it decreases her  mobility.  She is agreeable to hinged knee brace.  Outpatient follow-up with orthopedics      Final diagnoses:  Acute pain of right knee    ED Discharge Orders     None          Towana Ozell BROCKS, MD 06/20/24 1727

## 2024-06-20 NOTE — Discharge Instructions (Signed)
 Your x-ray did not show any obvious fracture or dislocation.  Please use the brace for support.  Ice for the next day or 220 minutes on 20 minutes off while awake.  Ibuprofen  or Naprosyn .  Contact orthopedics for follow-up.  Return if any worsening or concerning symptoms

## 2024-06-20 NOTE — ED Notes (Signed)

## 2024-06-20 NOTE — ED Triage Notes (Signed)
 Pt c/o RT knee pain x 2d; she hit it on car door falling out of car

## 2024-06-20 NOTE — ED Notes (Signed)
 Cannot discharge, registration in chart.
